# Patient Record
Sex: Female | Born: 1980
Health system: Southern US, Community
[De-identification: ages and names within clinical notes are randomized; demographics above are authoritative.]

## PROBLEM LIST (undated history)

## (undated) ENCOUNTER — Inpatient Hospital Stay (HOSPITAL_COMMUNITY): Payer: Self-pay

## (undated) DIAGNOSIS — R112 Nausea with vomiting, unspecified: Secondary | ICD-10-CM

## (undated) DIAGNOSIS — R51 Headache: Secondary | ICD-10-CM

## (undated) DIAGNOSIS — R011 Cardiac murmur, unspecified: Secondary | ICD-10-CM

## (undated) DIAGNOSIS — O139 Gestational [pregnancy-induced] hypertension without significant proteinuria, unspecified trimester: Secondary | ICD-10-CM

## (undated) DIAGNOSIS — R12 Heartburn: Secondary | ICD-10-CM

## (undated) DIAGNOSIS — R87629 Unspecified abnormal cytological findings in specimens from vagina: Secondary | ICD-10-CM

## (undated) DIAGNOSIS — Z9889 Other specified postprocedural states: Secondary | ICD-10-CM

## (undated) DIAGNOSIS — J069 Acute upper respiratory infection, unspecified: Secondary | ICD-10-CM

## (undated) DIAGNOSIS — O26899 Other specified pregnancy related conditions, unspecified trimester: Secondary | ICD-10-CM

## (undated) HISTORY — PX: TONSILLECTOMY: SUR1361

## (undated) HISTORY — PX: BREAST SURGERY: SHX581

## (undated) HISTORY — PX: AUGMENTATION MAMMAPLASTY: SUR837

## (undated) HISTORY — PX: WISDOM TOOTH EXTRACTION: SHX21

## (undated) HISTORY — DX: Acute upper respiratory infection, unspecified: J06.9

---

## 2000-04-29 ENCOUNTER — Ambulatory Visit (HOSPITAL_BASED_OUTPATIENT_CLINIC_OR_DEPARTMENT_OTHER): Admission: RE | Admit: 2000-04-29 | Discharge: 2000-04-30 | Payer: Self-pay | Admitting: Otolaryngology

## 2000-04-29 ENCOUNTER — Encounter (INDEPENDENT_AMBULATORY_CARE_PROVIDER_SITE_OTHER): Payer: Self-pay | Admitting: *Deleted

## 2002-05-07 ENCOUNTER — Emergency Department (HOSPITAL_COMMUNITY): Admission: EM | Admit: 2002-05-07 | Discharge: 2002-05-07 | Payer: Self-pay | Admitting: Emergency Medicine

## 2004-06-09 ENCOUNTER — Other Ambulatory Visit: Admission: RE | Admit: 2004-06-09 | Discharge: 2004-06-09 | Payer: Self-pay | Admitting: Obstetrics and Gynecology

## 2004-06-11 ENCOUNTER — Encounter: Admission: RE | Admit: 2004-06-11 | Discharge: 2004-06-11 | Payer: Self-pay | Admitting: Obstetrics and Gynecology

## 2005-06-25 ENCOUNTER — Other Ambulatory Visit: Admission: RE | Admit: 2005-06-25 | Discharge: 2005-06-25 | Payer: Self-pay | Admitting: Obstetrics and Gynecology

## 2007-01-03 ENCOUNTER — Ambulatory Visit (HOSPITAL_BASED_OUTPATIENT_CLINIC_OR_DEPARTMENT_OTHER): Admission: RE | Admit: 2007-01-03 | Discharge: 2007-01-03 | Payer: Self-pay | Admitting: Family Medicine

## 2007-01-09 ENCOUNTER — Ambulatory Visit: Payer: Self-pay | Admitting: Internal Medicine

## 2008-05-19 ENCOUNTER — Inpatient Hospital Stay (HOSPITAL_COMMUNITY): Admission: AD | Admit: 2008-05-19 | Discharge: 2008-05-20 | Payer: Self-pay | Admitting: Obstetrics and Gynecology

## 2008-05-27 ENCOUNTER — Inpatient Hospital Stay (HOSPITAL_COMMUNITY): Admission: AD | Admit: 2008-05-27 | Discharge: 2008-05-27 | Payer: Self-pay | Admitting: Obstetrics and Gynecology

## 2008-06-04 ENCOUNTER — Inpatient Hospital Stay (HOSPITAL_COMMUNITY): Admission: RE | Admit: 2008-06-04 | Discharge: 2008-06-07 | Payer: Self-pay | Admitting: Obstetrics & Gynecology

## 2008-06-08 ENCOUNTER — Encounter: Admission: RE | Admit: 2008-06-08 | Discharge: 2008-07-08 | Payer: Self-pay | Admitting: Obstetrics and Gynecology

## 2008-07-09 ENCOUNTER — Encounter: Admission: RE | Admit: 2008-07-09 | Discharge: 2008-08-07 | Payer: Self-pay | Admitting: Obstetrics and Gynecology

## 2008-08-08 ENCOUNTER — Encounter: Admission: RE | Admit: 2008-08-08 | Discharge: 2008-09-07 | Payer: Self-pay | Admitting: Obstetrics and Gynecology

## 2008-09-08 ENCOUNTER — Encounter: Admission: RE | Admit: 2008-09-08 | Discharge: 2008-10-07 | Payer: Self-pay | Admitting: Obstetrics and Gynecology

## 2008-10-08 ENCOUNTER — Encounter: Admission: RE | Admit: 2008-10-08 | Discharge: 2008-11-07 | Payer: Self-pay | Admitting: Obstetrics and Gynecology

## 2008-11-08 ENCOUNTER — Encounter: Admission: RE | Admit: 2008-11-08 | Discharge: 2008-12-08 | Payer: Self-pay | Admitting: Obstetrics and Gynecology

## 2008-11-20 ENCOUNTER — Ambulatory Visit (HOSPITAL_COMMUNITY): Admission: RE | Admit: 2008-11-20 | Discharge: 2008-11-20 | Payer: Self-pay | Admitting: Family Medicine

## 2008-12-09 ENCOUNTER — Encounter: Admission: RE | Admit: 2008-12-09 | Discharge: 2009-01-05 | Payer: Self-pay | Admitting: Obstetrics and Gynecology

## 2009-01-06 ENCOUNTER — Encounter: Admission: RE | Admit: 2009-01-06 | Discharge: 2009-01-25 | Payer: Self-pay | Admitting: Obstetrics and Gynecology

## 2010-11-09 ENCOUNTER — Encounter: Payer: Self-pay | Admitting: Family Medicine

## 2011-03-03 ENCOUNTER — Other Ambulatory Visit: Payer: Self-pay | Admitting: Obstetrics and Gynecology

## 2011-03-03 NOTE — Discharge Summary (Signed)
NAMEIDANIA, Brandi Webster               ACCOUNT NO.:  [id number]   MEDICAL RECORD NO.:  [id number]          PATIENT TYPE:  INP   LOCATION:  9105                          FACILITY:  [facility]   PHYSICIAN:  Randye Lobo, M.D.   DATE OF BIRTH:  [birth year]   DATE OF ADMISSION:  06/04/2008  DATE OF DISCHARGE:  06/07/2008                               DISCHARGE SUMMARY   FINAL DIAGNOSIS:  Intrauterine pregnancy at 33 weeks' gestation,  pregnancy-induced hypertension, failed induction of labor, rest of the  second stage of labor, maternal fever, and occiput posterior  presentation.   PROCEDURE:  Primary low transverse cesarean section.   SURGEON:  Gerrit Friends. Aldona Bar, M.D.   COMPLICATIONS:  None.   This 30 year old G1 P0 presents at 73 weeks' gestation for an induction  secondary to pregnancy-induced high blood pressure.  The patient's  antepartum course otherwise up to this point had been uncomplicated.  The patient had been having antepartum nonstress test performed which  were reactive prior to her induction.  The patient was admitted on  June 04, 2008, for induction of labor.  She progressed nicely, but she  began to push for about 2 hours with vertex still at zero station.  She  also started developing maternal temperature and at this point, decision  was to proceed with a cesarean section secondary to rest disorder of  descent.  The patient was taken to the operating room on June 04, 2008, by Dr. Annamaria Helling where a primary low transverse cesarean section  was performed with the delivery of a 7 pounds 14 ounces female infant with  Apgars of 9 and 9.  Delivery went without complications.  The patient's  postoperative course was benign without any significant fevers.  The  patient was afebrile.  The patient was kept in the hospital to  postoperative day #3.  She wanted her little boy circumcised prior to  discharge which was performed.  She was sent home on a regular diet,  told to decrease  activities, told to continue her vitamins and her iron  supplement daily, was given Vicodin 1-2 every 4-6 hours as needed for  pain, told she could use over-the-counter ibuprofen up to 600 mg every 6  hours as needed for pain, was to follow up in our office in 4 weeks.  Instructions and precautions were reviewed with the patient.  Labs on  discharge, the patient had a hemoglobin of 9.9, white blood cell count  of 13.1, and platelets of 149,000.      Leilani Able, P.A.-C.      Randye Lobo, M.D.  Electronically Signed    MB/MEDQ  D:  06/26/2008  T:  06/27/2008  Job:  [id number]

## 2011-03-03 NOTE — Op Note (Signed)
Brandi Webster, Brandi Webster               ACCOUNT NO.:  [id number]   MEDICAL RECORD NO.:  [id number]          PATIENT TYPE:  INP   LOCATION:  9105                          FACILITY:  [facility]   PHYSICIAN:  Gerrit Friends. Aldona Bar, M.D.   DATE OF BIRTH:  [birth year]   DATE OF PROCEDURE:  06/04/2008  DATE OF DISCHARGE:                               OPERATIVE REPORT   PREOPERATIVE DIAGNOSES:  1. A 38-week intrauterine pregnancy.  2. Failed induction.  3. Arrest of second stage of labor.  4. Maternal fever.   POSTOPERATIVE DIAGNOSES:  1. A 38-week intrauterine pregnancy.  2. Failed induction.  3. Arrest of second stage of labor.  4. Maternal fever.  5. Occiput posterior presentation.  6. Delivery of 7 pounds 14 ounces female infant with Apgars of 9 and 9.   PROCEDURE:  Primary low transverse cesarean section.   SURGEON:  Gerrit Friends. Aldona Bar, MD   ANESTHESIA:  Epidural.   HISTORY:  This 30 year old primigravida was admitted at 38 weeks for  induction because of hypertension.  She progressed very nicely in labor  but after pushing for approximately 2 hours, the vertex was still at 0  station at best and an arrest disorder of descent was diagnosis.  In  addition, a maternal fever was detected 200.9 degrees.  The patient was  scheduled for emergency cesarean section because of the failure to  progress.   The patient was taken to the operating room and prior to her arrival in  the operating room, her epidural was augmented and a dose of Ancef, 1 g  was given IV.  She had a Foley catheter placed earlier in the day.   Once in the operating room, she was prepped and draped in the usual  fashion.  After being placed in the supine position slightly tilted to  left and after she was prepped and draped and good anesthetic levels  were documented procedure was begun.   A Pfannenstiel incision was made and with minimal difficulty dissected  down sharply to and through the fascia in low transverse fashion with  hemostasis created at each layer.  Subfascial space was created  inferiorly and superiorly, muscles were separated in the midline.  I  have identified them appropriately with care taken to avoid the bowel  superiorly and the bladder inferiorly.  At this time, the bladder blade  was placed and the vesicouterine peritoneum was incised in low  transverse fashion and pushed off the lower segment with ease.  A sharp  incision into the lower segment and a low transverse fashion was carried  out with Metzenbaum scissors and extended laterally.  Thereafter, with  minimal difficulty, a viable female infant, which cried spontaneously at  once, was delivered from an OP presentation, vertex.  Infant cried  spontaneously at once and after the cord was clamped and cut, the infant  was passed off the awaiting team.  Infant weighed 7 pounds 14 ounces at  Apgars of 9 and 9 with subsequently taken to the nursery in good  condition.   Placenta was delivered intact and passed off the  cord blood donation  team.  The patient is a cord blood donor.   At this time, the uterus was rendered free of any remaining products of  conception and was exteriorized.  Good uterine contractility was  afforded with slowly given intravenous Pitocin and manual stimulation.  Closure of the uterine incision was then carried out using a single  layer of #1 Vicryl in a running locking fashion and several figure-of-  eight #1 Vicryls were placed thereafter for additional hemostasis.  Tubes and ovaries were inspected and noted be normal.  Uterine incision  was dry.  At this time, the abdomen lavaged of all free blood and clot  and the uterus was replaced into the abdominal cavity.  All counts were  noted to be correct and no foreign bodies were noted to the remaining of  the abdominal cavity.  At this time closure of the abdomen was begun in  layers.  The abdominal peritoneum was closed with 0 Vicryl in a running  fashion and muscle  secured with the same.  Assured of good fascial  hemostasis.  The fascia was then reapproximated with 0 Vicryl from angle  to midline bilaterally.  Subcutaneous tissue was running hemostatic and  staples were used to close the skin.  Sterile pressure dressing was  applied.  At this time, the patient was transport to recovery in  satisfactory condition having tolerated the procedure well.  Estimated  blood loss 500 mL.  All counts correct x2.   In summary, this patient had an arrest in the second stage of labor  after a failed induction attempt for hypertension at 38 weeks'  gestation.  In addition, there was a maternal fever noted.  In the  operating room, an occiput posterior presentation was discovered.   At the conclusion of procedure both mother and baby were doing well in  the respective recovery areas.      Gerrit Friends. Aldona Bar, M.D.  Electronically Signed     RMW/MEDQ  D:  06/04/2008  T:  06/05/2008  Job:  [id number]

## 2011-03-06 NOTE — Procedures (Signed)
NAME:  Brandi Webster, Brandi Webster               ACCOUNT NO.:  [id number]   MEDICAL RECORD NO.:  [id number]          PATIENT TYPE:  OUT   LOCATION:  SLEEP CENTER                 FACILITY:  [facility]   PHYSICIAN:  Clinton D. Maple Hudson, MD, FCCP, FACPDATE OF BIRTH:  07-17-[birth year]   DATE OF STUDY:                            NOCTURNAL POLYSOMNOGRAM   INDICATIONS FOR PROCEDURE:  Hypersomnia with sleep apnea. Sleep walking,  sleep talking.   HOME MEDICATIONS:  Limited to multivitamin, fish oil, and Omega III.   RESULTS:  Epward sleepiness score 12/24, BMI 20.5, weight 116 pounds.   SLEEP ARCHITECTURE:  Total sleep time 348 minutes with sleep efficiency  985. Stage 1 was 1%; Stage 2, 58%; Stages 3 and 4, 26%. REM 16% of total  sleep time. Sleep latency 7 minutes, REM latency 65 minutes, awake after  sleep onset 3 minutes. Arousal index 9.7. No bedtime medication was  taken.   RESPIRATORY DATA:  Apnea hypopnea index (AHI, RDI) zero obstructive  events per hour. She slept supine and on right side.   OXYGEN DATA:  Mild snoring with oxygen desaturation to a nadir of 95%.  Mean oxygen saturation through the study was 98% on room air.   CARDIAC DATA:  Normal sinus rhythm.   MOVEMENT/PARASOMNIA:  A total of 28 limb jerks were recorded, of which 9  were associated with arousal or awakening for periodic limb movement  with arousal index of 1.6 per hour, which is insignificant. No bathroom  trips. No unusual movement or behavior was recorded.   IMPRESSION/RECOMMENDATIONS:  1. Unremarkable sleep architecture with no unusual movement behavior      or sleep associated activity.  2. No sleep disordered breathing events were recorded.      Clinton D. Maple Hudson, MD, [facility], FACP  Diplomate, Biomedical engineer of Sleep Medicine  Electronically Signed     CDY/MEDQ  D:  01/09/2007 15:41:36  T:  01/09/2007 19:07:53  Job:  [id number]

## 2011-07-17 LAB — URINALYSIS, ROUTINE W REFLEX MICROSCOPIC
Bilirubin Urine: NEGATIVE
Hgb urine dipstick: NEGATIVE
Nitrite: NEGATIVE
Specific Gravity, Urine: 1.015
Urobilinogen, UA: 0.2
pH: 6

## 2011-07-17 LAB — URINE MICROSCOPIC-ADD ON: RBC / HPF: NONE SEEN

## 2013-05-02 LAB — OB RESULTS CONSOLE GC/CHLAMYDIA
CHLAMYDIA, DNA PROBE: NEGATIVE
GC PROBE AMP, GENITAL: NEGATIVE

## 2013-06-13 LAB — OB RESULTS CONSOLE HGB/HCT, BLOOD
HCT: 35 %
Hemoglobin: 11.4 g/dL

## 2013-06-13 LAB — OB RESULTS CONSOLE HIV ANTIBODY (ROUTINE TESTING): HIV: NONREACTIVE

## 2013-06-13 LAB — OB RESULTS CONSOLE RUBELLA ANTIBODY, IGM: Rubella: IMMUNE

## 2013-07-11 LAB — OB RESULTS CONSOLE HEPATITIS B SURFACE ANTIGEN: HEP B S AG: NEGATIVE

## 2013-07-11 LAB — OB RESULTS CONSOLE RPR: RPR: NONREACTIVE

## 2013-07-11 LAB — OB RESULTS CONSOLE ABO/RH: RH Type: POSITIVE

## 2013-10-19 NOTE — L&D Delivery Note (Addendum)
TOLAC patient was C/C/+2 and pushed for approx 20 minutes with epidural.   NSVD female infant, ROA, Apgars 9/9, weight pending.   The patient had a second degree perineal lac which extended superficially to the anus, but not inside, sphincter was visible and completely intact. She also had extensive bilateral labial tears repaired with 3-0vicryl and a small periurethral tear repaired with 3-0 vicryl. Fundus was firm. EBL was expected. Placenta was delivered intact. Vagina was clear.  Baby was vigorous and doing skin to skin with mother.  Circ desired.  Philip AspenALLAHAN, Wilhelmenia Addis

## 2013-10-22 ENCOUNTER — Encounter (HOSPITAL_COMMUNITY): Payer: Self-pay

## 2013-10-22 ENCOUNTER — Inpatient Hospital Stay (HOSPITAL_COMMUNITY)
Admission: AD | Admit: 2013-10-22 | Discharge: 2013-10-24 | DRG: 778 | Disposition: A | Discharge status: Home / Self Care | Payer: BC Managed Care – PPO | Source: Ambulatory Visit | Attending: Obstetrics and Gynecology | Admitting: Obstetrics and Gynecology

## 2013-10-22 ENCOUNTER — Inpatient Hospital Stay (HOSPITAL_COMMUNITY): Payer: BC Managed Care – PPO

## 2013-10-22 DIAGNOSIS — O479 False labor, unspecified: Secondary | ICD-10-CM

## 2013-10-22 DIAGNOSIS — O34219 Maternal care for unspecified type scar from previous cesarean delivery: Secondary | ICD-10-CM

## 2013-10-22 DIAGNOSIS — O4703 False labor before 37 completed weeks of gestation, third trimester: Secondary | ICD-10-CM

## 2013-10-22 DIAGNOSIS — O4700 False labor before 37 completed weeks of gestation, unspecified trimester: Principal | ICD-10-CM | POA: Diagnosis present

## 2013-10-22 LAB — CBC
HEMATOCRIT: 33.9 % — AB (ref 36.0–46.0)
HEMOGLOBIN: 11.6 g/dL — AB (ref 12.0–15.0)
MCH: 30.9 pg (ref 26.0–34.0)
MCHC: 34.2 g/dL (ref 30.0–36.0)
MCV: 90.2 fL (ref 78.0–100.0)
Platelets: 234 10*3/uL (ref 150–400)
RBC: 3.76 MIL/uL — AB (ref 3.87–5.11)
RDW: 12.9 % (ref 11.5–15.5)
WBC: 13.7 10*3/uL — AB (ref 4.0–10.5)

## 2013-10-22 LAB — URINALYSIS, ROUTINE W REFLEX MICROSCOPIC
Bilirubin Urine: NEGATIVE
GLUCOSE, UA: NEGATIVE mg/dL
Hgb urine dipstick: NEGATIVE
KETONES UR: NEGATIVE mg/dL
LEUKOCYTES UA: NEGATIVE
Nitrite: NEGATIVE
PH: 6.5 (ref 5.0–8.0)
Protein, ur: NEGATIVE mg/dL
Urobilinogen, UA: 0.2 mg/dL (ref 0.0–1.0)

## 2013-10-22 LAB — TYPE AND SCREEN
ABO/RH(D): O POS
ANTIBODY SCREEN: NEGATIVE

## 2013-10-22 LAB — OB RESULTS CONSOLE GBS: STREP GROUP B AG: NEGATIVE

## 2013-10-22 MED ORDER — PRENATAL MULTIVITAMIN CH
1.0000 | ORAL_TABLET | Freq: Every day | ORAL | Status: DC
  Administered 2013-10-23: 1 via ORAL
  Filled 2013-10-22: qty 1

## 2013-10-22 MED ORDER — MAGNESIUM SULFATE 40 G IN LACTATED RINGERS - SIMPLE
2.0000 g/h | INTRAVENOUS | Status: AC
  Administered 2013-10-23: 2 g/h via INTRAVENOUS
  Filled 2013-10-22 (×2): qty 500

## 2013-10-22 MED ORDER — ZOLPIDEM TARTRATE 5 MG PO TABS: 5.0000 mg | ORAL_TABLET | Freq: Every evening | ORAL | Status: DC | PRN

## 2013-10-22 MED ORDER — NIFEDIPINE 10 MG PO CAPS
10.0000 mg | ORAL_CAPSULE | Freq: Once | ORAL | Status: AC
  Administered 2013-10-22: 10 mg via ORAL
  Filled 2013-10-22: qty 1

## 2013-10-22 MED ORDER — IBUPROFEN 600 MG PO TABS
600.0000 mg | ORAL_TABLET | Freq: Once | ORAL | Status: AC
  Administered 2013-10-22: 600 mg via ORAL
  Filled 2013-10-22: qty 1

## 2013-10-22 MED ORDER — NIFEDIPINE 10 MG PO CAPS
10.0000 mg | ORAL_CAPSULE | ORAL | Status: AC | PRN
  Administered 2013-10-22 (×4): 10 mg via ORAL
  Filled 2013-10-22 (×4): qty 1

## 2013-10-22 MED ORDER — DOCUSATE SODIUM 100 MG PO CAPS
100.0000 mg | ORAL_CAPSULE | Freq: Every day | ORAL | Status: DC
  Administered 2013-10-23: 100 mg via ORAL
  Filled 2013-10-22 (×3): qty 1

## 2013-10-22 MED ORDER — BETAMETHASONE SOD PHOS & ACET 6 (3-3) MG/ML IJ SUSP
12.0000 mg | Freq: Once | INTRAMUSCULAR | Status: AC
  Administered 2013-10-22: 12 mg via INTRAMUSCULAR
  Filled 2013-10-22: qty 2

## 2013-10-22 MED ORDER — CALCIUM CARBONATE ANTACID 500 MG PO CHEW
2.0000 | CHEWABLE_TABLET | ORAL | Status: DC | PRN
  Filled 2013-10-22: qty 2

## 2013-10-22 MED ORDER — LACTATED RINGERS IV BOLUS (SEPSIS)
1000.0000 mL | INTRAVENOUS | Status: DC
  Administered 2013-10-22: 1000 mL via INTRAVENOUS

## 2013-10-22 MED ORDER — ACETAMINOPHEN 325 MG PO TABS: 650.0000 mg | ORAL_TABLET | ORAL | Status: DC | PRN

## 2013-10-22 MED ORDER — BETAMETHASONE SOD PHOS & ACET 6 (3-3) MG/ML IJ SUSP
12.0000 mg | Freq: Once | INTRAMUSCULAR | Status: DC
  Filled 2013-10-22: qty 2

## 2013-10-22 MED ORDER — BETAMETHASONE SOD PHOS & ACET 6 (3-3) MG/ML IJ SUSP
12.0000 mg | Freq: Once | INTRAMUSCULAR | Status: AC
  Administered 2013-10-23: 12 mg via INTRAMUSCULAR
  Filled 2013-10-22: qty 2

## 2013-10-22 MED ORDER — LACTATED RINGERS IV SOLN
INTRAVENOUS | Status: DC
  Administered 2013-10-22 – 2013-10-24 (×4): via INTRAVENOUS

## 2013-10-22 MED ORDER — MAGNESIUM SULFATE BOLUS VIA INFUSION
4.0000 g | Freq: Once | INTRAVENOUS | Status: AC
  Administered 2013-10-22: 4 g via INTRAVENOUS
  Filled 2013-10-22: qty 500

## 2013-10-22 NOTE — H&P (Signed)
Pt is a 33 y/o white female G2P1001 who presented to the ER c/o persistent ctxs which began several hours after she had sex. In the ER the pt was having ctxs q 3-608mins. She was given 4 doses of Procardia and IVFs. The contractions persisted. She can not tolerate terb. She could not have a FFN. She is admitted for Mag. She has a h.o.a C/S and plans to have a TOLAC, Please see NP H and P for PE/PMHX.  Plan/ Admit with Mag. Will give Betamethasone. If contractions do not stop will start antibiotics. Check GBS.

## 2013-10-22 NOTE — MAU Note (Signed)
Pt presents complaining of contractions every 2 minutes since 9am. States she tried drinking water and resting. Denies vaginal bleeding and loss of fluid.

## 2013-10-22 NOTE — MAU Provider Note (Signed)
Chief Complaint:  Contractions   First Provider Initiated Contact with Patient 10/22/13 1620      HPI: Brandi Webster is a 33 y.o. G2P1001 at 4878w6d who presents to maternity admissions reporting mild contractions every 2 minutes since 9 AM. No improvement with hydration. No history of preterm labor or short cervix this pregnancy. Received terbutaline once at 35 weeks with previous pregnancy and felt extremely anxious and tachycardic. Denies leakage of fluid or vaginal bleeding. Good fetal movement. Last IC last night.   Pregnancy Course: Uncomplicated. Prior C/S 5 years ago for FTP and maternal fever at 10 cm.   Past Medical History: History reviewed. No pertinent past medical history.  Past obstetric history: OB History  Gravida Para Term Preterm AB SAB TAB Ectopic Multiple Living  2 1 1       1     # Outcome Date GA Lbr Len/2nd Weight Sex Delivery Anes PTL Lv  2 CUR           1 TRM      LTCS   Y      Past Surgical History: Past Surgical History  Procedure Laterality Date  . Cesarean section       Family History: History reviewed. No pertinent family history.  Social History: History  Substance Use Topics  . Smoking status: Never Smoker   . Smokeless tobacco: Not on file  . Alcohol Use: No    Allergies: No Known Allergies  Meds:  Prescriptions prior to admission  Medication Sig Dispense Refill  . Docosahexaenoic Acid (DHA PO) Take 1 tablet by mouth daily.      . Prenatal Vit-Fe Fumarate-FA (PRENATAL MULTIVITAMIN) TABS tablet Take 1 tablet by mouth daily at 12 noon.        ROS: Pertinent findings in history of present illness.  Physical Exam  Blood pressure 131/71, pulse 98, temperature 97.4 F (36.3 C), temperature source Oral, resp. rate 18, last menstrual period 03/20/2013. GENERAL: Well-developed, well-nourished female in mild distress.  HEENT: normocephalic HEART: normal rate RESP: normal effort ABDOMEN: Soft, non-tender, gravid appropriate for gestational  age EXTREMITIES: Nontender, no edema NEURO: alert and oriented SPECULUM EXAM: NEFG, physiologic discharge and small amount of clear mucus, no blood, cervix clean. Unable to collect fFN due to IC < 24 hours ago. Dilation: Closed Effacement (%): 50 Cervical Position: Posterior, soft Station: Ballotable Exam by:: Ivonne AndrewV Jaxsyn Catalfamo CNM Vertex/slightly right oblique per leopold's  FHT:  Baseline 140 , moderate variability, accelerations present, no decelerations Contractions: q 2-3 mins, mild   Labs: Results for orders placed during the hospital encounter of 10/22/13 (from the past 24 hour(s))  URINALYSIS, ROUTINE W REFLEX MICROSCOPIC     Status: Abnormal   Collection Time    10/22/13  4:00 PM      Result Value Range   Color, Urine YELLOW  YELLOW   APPearance CLEAR  CLEAR   Specific Gravity, Urine <1.005 (*) 1.005 - 1.030   pH 6.5  5.0 - 8.0   Glucose, UA NEGATIVE  NEGATIVE mg/dL   Hgb urine dipstick NEGATIVE  NEGATIVE   Bilirubin Urine NEGATIVE  NEGATIVE   Ketones, ur NEGATIVE  NEGATIVE mg/dL   Protein, ur NEGATIVE  NEGATIVE mg/dL   Urobilinogen, UA 0.2  0.0 - 1.0 mg/dL   Nitrite NEGATIVE  NEGATIVE   Leukocytes, UA NEGATIVE  NEGATIVE   Imaging:  No results found.  MAU Course: IV bolus, Procardia.   Contractions spaced out to Q3-7 minutes. No cervical change. Notified  Dr. Dareen Piano of cervical exam, contractions, prior C-section, interventions up to this point. Recommends terbutaline and betamethasone. Informed him of patient's adverse reaction to terbutaline and refusal when offered by CNM. Recommended 30 more minutes of IV fluids. May have to observe patient overnight and give mag sulfate if contractions continue.   CNM recommended terbutaline to patient. She became tearful and stated she was very afraid to have it again due to prior adverse reaction. Informed her that if contractions continue this frequently Dr. Dareen Piano would recommend observation and magnesium sulfate. Patient is  agreeable. States she wants to do whatever is best for her baby.  Assessment: 1. Preterm contractions, third trimester   2. History of cesarean delivery, currently pregnant    Plan: Observe on antenatal per consult with Dr. Dareen Piano. Magnesium sulfate 4 g load, then 2 g per hour. Dr. Dareen Piano assuming care of patient. GBS  [name], [place] 10/22/2013 7:46 PM

## 2013-10-23 LAB — ABO/RH: ABO/RH(D): O POS

## 2013-10-23 NOTE — Progress Notes (Signed)
No complaints, still feeling occasional ctx, but much more mild and less frequent.  No LOF/VB.  Good FM.  Filed Vitals:   10/23/13 0830 10/23/13 0930 10/23/13 1030 10/23/13 1212  BP:    115/59  Pulse:    97  Temp:    97.7 F (36.5 C)  TempSrc:    Oral  Resp: 18 20 18 18   Height:      Weight:      SpO2:         Lab Results  Component Value Date   WBC 13.7* 10/22/2013   HGB 11.6* 10/22/2013   HCT 33.9* 10/22/2013   MCV 90.2 10/22/2013   PLT 234 10/22/2013    --/--/O POS, O POS (01/04 2000)  A/P PT ctx.. Will continue MgSO4 until 24hrs, 2nd dose betamethasone tonight.  Will monitor overnight and if stable plan to d/c in am.   Philip AspenALLAHAN, Adea Geisel

## 2013-10-23 NOTE — Progress Notes (Signed)
Ur chart review completed.  

## 2013-10-24 NOTE — Progress Notes (Signed)
Pt. Is stable and ready to be discharged home. All discharge instructions given and all questions answered. Pt. To follow up with MD in one week. Pt. Left with husband.

## 2013-10-24 NOTE — Discharge Summary (Signed)
  Pt was admitted to tx PTL after efforts in the ER were unsuccessful. She was placed on MgSO4 and given Betamethasone. The contractions stopped. She will be on bedrest for 24 hours then increase activity. She will follow up in the office in one week.

## 2013-10-24 NOTE — Progress Notes (Signed)
Pt has had only a rare contraction in last 24 hours. Will stop Mg and discharge to home.

## 2013-10-26 LAB — CULTURE, BETA STREP (GROUP B ONLY)

## 2013-12-06 ENCOUNTER — Encounter (HOSPITAL_COMMUNITY): Payer: Self-pay | Admitting: Pharmacist

## 2013-12-18 ENCOUNTER — Encounter (HOSPITAL_COMMUNITY): Payer: Self-pay | Admitting: *Deleted

## 2013-12-18 ENCOUNTER — Encounter (HOSPITAL_COMMUNITY): Payer: BC Managed Care – PPO | Admitting: Anesthesiology

## 2013-12-18 ENCOUNTER — Inpatient Hospital Stay (HOSPITAL_COMMUNITY)
Admission: AD | Admit: 2013-12-18 | Discharge: 2013-12-20 | DRG: 774 | Disposition: A | Discharge status: Home / Self Care | Payer: BC Managed Care – PPO | Source: Ambulatory Visit | Attending: Obstetrics and Gynecology | Admitting: Obstetrics and Gynecology

## 2013-12-18 ENCOUNTER — Inpatient Hospital Stay (HOSPITAL_COMMUNITY): Payer: BC Managed Care – PPO | Admitting: Anesthesiology

## 2013-12-18 ENCOUNTER — Encounter (HOSPITAL_COMMUNITY): Payer: Self-pay

## 2013-12-18 ENCOUNTER — Encounter (HOSPITAL_COMMUNITY)
Admission: RE | Admit: 2013-12-18 | Discharge: 2013-12-18 | Disposition: A | Discharge status: Home / Self Care | Payer: BC Managed Care – PPO | Source: Ambulatory Visit | Attending: Obstetrics and Gynecology | Admitting: Obstetrics and Gynecology

## 2013-12-18 DIAGNOSIS — R011 Cardiac murmur, unspecified: Secondary | ICD-10-CM | POA: Diagnosis present

## 2013-12-18 DIAGNOSIS — O1002 Pre-existing essential hypertension complicating childbirth: Principal | ICD-10-CM | POA: Diagnosis present

## 2013-12-18 DIAGNOSIS — O34219 Maternal care for unspecified type scar from previous cesarean delivery: Secondary | ICD-10-CM | POA: Diagnosis present

## 2013-12-18 DIAGNOSIS — R12 Heartburn: Secondary | ICD-10-CM | POA: Diagnosis present

## 2013-12-18 HISTORY — DX: Other specified pregnancy related conditions, unspecified trimester: O26.899

## 2013-12-18 HISTORY — DX: Gestational (pregnancy-induced) hypertension without significant proteinuria, unspecified trimester: O13.9

## 2013-12-18 HISTORY — DX: Cardiac murmur, unspecified: R01.1

## 2013-12-18 HISTORY — DX: Other specified postprocedural states: Z98.890

## 2013-12-18 HISTORY — DX: Headache: R51

## 2013-12-18 HISTORY — DX: Other specified postprocedural states: R11.2

## 2013-12-18 HISTORY — DX: Heartburn: R12

## 2013-12-18 HISTORY — DX: Unspecified abnormal cytological findings in specimens from vagina: R87.629

## 2013-12-18 LAB — CBC
HCT: 33.9 % — ABNORMAL LOW (ref 36.0–46.0)
Hemoglobin: 11.2 g/dL — ABNORMAL LOW (ref 12.0–15.0)
MCH: 29.9 pg (ref 26.0–34.0)
MCHC: 33 g/dL (ref 30.0–36.0)
MCV: 90.6 fL (ref 78.0–100.0)
Platelets: 217 10*3/uL (ref 150–400)
RBC: 3.74 MIL/uL — ABNORMAL LOW (ref 3.87–5.11)
RDW: 14.1 % (ref 11.5–15.5)
WBC: 11.3 10*3/uL — AB (ref 4.0–10.5)

## 2013-12-18 LAB — TYPE AND SCREEN
ABO/RH(D): O POS
ANTIBODY SCREEN: NEGATIVE

## 2013-12-18 MED ORDER — OXYTOCIN BOLUS FROM INFUSION
500.0000 mL | INTRAVENOUS | Status: DC
  Administered 2013-12-18: 500 mL via INTRAVENOUS

## 2013-12-18 MED ORDER — WITCH HAZEL-GLYCERIN EX PADS: 1.0000 "application " | MEDICATED_PAD | CUTANEOUS | Status: DC | PRN

## 2013-12-18 MED ORDER — TETANUS-DIPHTH-ACELL PERTUSSIS 5-2.5-18.5 LF-MCG/0.5 IM SUSP: 0.5000 mL | Freq: Once | INTRAMUSCULAR | Status: DC

## 2013-12-18 MED ORDER — PHENYLEPHRINE 40 MCG/ML (10ML) SYRINGE FOR IV PUSH (FOR BLOOD PRESSURE SUPPORT)
80.0000 ug | PREFILLED_SYRINGE | INTRAVENOUS | Status: DC | PRN
  Filled 2013-12-18: qty 2
  Filled 2013-12-18: qty 10

## 2013-12-18 MED ORDER — DIBUCAINE 1 % RE OINT: 1.0000 "application " | TOPICAL_OINTMENT | RECTAL | Status: DC | PRN

## 2013-12-18 MED ORDER — OXYCODONE-ACETAMINOPHEN 5-325 MG PO TABS
1.0000 | ORAL_TABLET | ORAL | Status: DC | PRN
Start: 2013-12-18 — End: 2013-12-20

## 2013-12-18 MED ORDER — IBUPROFEN 600 MG PO TABS
600.0000 mg | ORAL_TABLET | Freq: Four times a day (QID) | ORAL | Status: DC
  Administered 2013-12-18 – 2013-12-20 (×6): 600 mg via ORAL
  Filled 2013-12-18 (×6): qty 1

## 2013-12-18 MED ORDER — ONDANSETRON HCL 4 MG/2ML IJ SOLN: 4.0000 mg | Freq: Four times a day (QID) | INTRAMUSCULAR | Status: DC | PRN

## 2013-12-18 MED ORDER — LACTATED RINGERS IV SOLN: INTRAVENOUS | Status: DC

## 2013-12-18 MED ORDER — DIPHENHYDRAMINE HCL 50 MG/ML IJ SOLN: 12.5000 mg | INTRAMUSCULAR | Status: DC | PRN

## 2013-12-18 MED ORDER — ONDANSETRON HCL 4 MG/2ML IJ SOLN
4.0000 mg | INTRAMUSCULAR | Status: DC | PRN
Start: 2013-12-18 — End: 2013-12-20

## 2013-12-18 MED ORDER — ONDANSETRON HCL 4 MG PO TABS: 4.0000 mg | ORAL_TABLET | ORAL | Status: DC | PRN

## 2013-12-18 MED ORDER — PHENYLEPHRINE 40 MCG/ML (10ML) SYRINGE FOR IV PUSH (FOR BLOOD PRESSURE SUPPORT)
80.0000 ug | PREFILLED_SYRINGE | INTRAVENOUS | Status: DC | PRN
  Filled 2013-12-18: qty 2

## 2013-12-18 MED ORDER — LACTATED RINGERS IV SOLN: 500.0000 mL | Freq: Once | INTRAVENOUS | Status: DC

## 2013-12-18 MED ORDER — FENTANYL 2.5 MCG/ML BUPIVACAINE 1/10 % EPIDURAL INFUSION (WH - ANES)
14.0000 mL/h | INTRAMUSCULAR | Status: DC | PRN
  Filled 2013-12-18: qty 125

## 2013-12-18 MED ORDER — SENNOSIDES-DOCUSATE SODIUM 8.6-50 MG PO TABS
2.0000 | ORAL_TABLET | ORAL | Status: DC
  Administered 2013-12-20: 2 via ORAL
  Filled 2013-12-18: qty 2

## 2013-12-18 MED ORDER — EPHEDRINE 5 MG/ML INJ
10.0000 mg | INTRAVENOUS | Status: DC | PRN
  Filled 2013-12-18: qty 4
  Filled 2013-12-18: qty 2

## 2013-12-18 MED ORDER — ZOLPIDEM TARTRATE 5 MG PO TABS: 5.0000 mg | ORAL_TABLET | Freq: Every evening | ORAL | Status: DC | PRN

## 2013-12-18 MED ORDER — FENTANYL 2.5 MCG/ML BUPIVACAINE 1/10 % EPIDURAL INFUSION (WH - ANES)
INTRAMUSCULAR | Status: DC | PRN
  Administered 2013-12-18: 14 mL/h via EPIDURAL

## 2013-12-18 MED ORDER — OXYTOCIN 40 UNITS IN LACTATED RINGERS INFUSION - SIMPLE MED
62.5000 mL/h | INTRAVENOUS | Status: DC
  Filled 2013-12-18: qty 1000

## 2013-12-18 MED ORDER — IBUPROFEN 600 MG PO TABS
600.0000 mg | ORAL_TABLET | Freq: Four times a day (QID) | ORAL | Status: DC | PRN
  Filled 2013-12-18: qty 1

## 2013-12-18 MED ORDER — TERBUTALINE SULFATE 1 MG/ML IJ SOLN
INTRAMUSCULAR | Status: AC
  Filled 2013-12-18: qty 1

## 2013-12-18 MED ORDER — LIDOCAINE HCL (PF) 1 % IJ SOLN
30.0000 mL | INTRAMUSCULAR | Status: DC | PRN
  Filled 2013-12-18: qty 30

## 2013-12-18 MED ORDER — PRENATAL MULTIVITAMIN CH
1.0000 | ORAL_TABLET | Freq: Every day | ORAL | Status: DC
  Administered 2013-12-19: 1 via ORAL
  Filled 2013-12-18: qty 1

## 2013-12-18 MED ORDER — CITRIC ACID-SODIUM CITRATE 334-500 MG/5ML PO SOLN: 30.0000 mL | ORAL | Status: DC | PRN

## 2013-12-18 MED ORDER — LANOLIN HYDROUS EX OINT: TOPICAL_OINTMENT | CUTANEOUS | Status: DC | PRN

## 2013-12-18 MED ORDER — BENZOCAINE-MENTHOL 20-0.5 % EX AERO
1.0000 "application " | INHALATION_SPRAY | CUTANEOUS | Status: DC | PRN
  Administered 2013-12-19: 1 via TOPICAL
  Filled 2013-12-18: qty 56

## 2013-12-18 MED ORDER — LACTATED RINGERS IV SOLN
500.0000 mL | INTRAVENOUS | Status: DC | PRN
Start: 2013-12-18 — End: 2013-12-18

## 2013-12-18 MED ORDER — ACETAMINOPHEN 325 MG PO TABS: 650.0000 mg | ORAL_TABLET | ORAL | Status: DC | PRN

## 2013-12-18 MED ORDER — LIDOCAINE HCL (PF) 1 % IJ SOLN
INTRAMUSCULAR | Status: DC | PRN
  Administered 2013-12-18: 7 mL
  Administered 2013-12-18: 9 mL

## 2013-12-18 MED ORDER — DIPHENHYDRAMINE HCL 25 MG PO CAPS: 25.0000 mg | ORAL_CAPSULE | Freq: Four times a day (QID) | ORAL | Status: DC | PRN

## 2013-12-18 MED ORDER — SIMETHICONE 80 MG PO CHEW
80.0000 mg | CHEWABLE_TABLET | ORAL | Status: DC | PRN
Start: 2013-12-18 — End: 2013-12-20

## 2013-12-18 MED ORDER — EPHEDRINE 5 MG/ML INJ
10.0000 mg | INTRAVENOUS | Status: DC | PRN
  Filled 2013-12-18: qty 2

## 2013-12-18 NOTE — Patient Instructions (Addendum)
   Your procedure is scheduled on: Tuesday, Mar 3  [facility]  1030 AM Pick up the phone at the desk and dial [phone] and inform us of your arrival.  Please call this number if you have any problems the morning of surgery: [phone]  Remember: Do not eat food after midnight: Monday Do not drink clear liquids after: 8 AM Tuesday, day of surgery Take these medicines the morning of surgery with a SIP OF WATER:  None  Do not wear jewelry, make-up, or FINGER nail polish No metal in your hair or on your body. Do not wear lotions, powders, perfumes.  You may wear deodorant.  Do not bring valuables to the hospital. Contacts, dentures or bridgework may not be worn into surgery.  Leave suitcase in the car. After Surgery it may be brought to your room. For patients being admitted to the hospital, checkout time is 11:00am the day of discharge.  Home with husband B.J. cell [phone]

## 2013-12-18 NOTE — MAU Note (Signed)
Scheduled for a c/s,as they would not let her go past 40wks.  Was induced for PIH with first- no BP problems with present.  Would prefer VBAC

## 2013-12-18 NOTE — H&P (Signed)
33 y.o. 1732w0d  G2P1001 comes in c/o ctx.  Otherwise has good fetal movement and no bleeding.  Past Medical History  Diagnosis Date  . PONV (postoperative nausea and vomiting)   . Heartburn in pregnancy   . Headache(784.0)     otc med prn  . Heart murmur   . Pregnancy induced hypertension     first preg  . Preterm labor     2nd preg  . Vaginal Pap smear, abnormal     colpo, ok since    Past Surgical History  Procedure Laterality Date  . Cesarean section  2009  . Tonsillectomy    . Wisdom tooth extraction    . Breast surgery      augmentation    OB History  Gravida Para Term Preterm AB SAB TAB Ectopic Multiple Living  2 1 1       1     # Outcome Date GA Lbr Len/2nd Weight Sex Delivery Anes PTL Lv  2 CUR           1 TRM 06/04/08 3281w0d  3.572 kg (7 lb 14 oz) M LTCS EPI  Y     Comments: PIH, arrest of descent      History   Social History  . Marital Status: Married    Spouse Name: N/A    Number of Children: N/A  . Years of Education: N/A   Occupational History  . Not on file.   Social History Main Topics  . Smoking status: Never Smoker   . Smokeless tobacco: Never Used  . Alcohol Use: No  . Drug Use: No  . Sexual Activity: Yes    Birth Control/ Protection: None     Comment: pregnant   Other Topics Concern  . Not on file   Social History Narrative  . No narrative on file   Codeine    Prenatal Transfer Tool  Maternal Diabetes: No Genetic Screening: Normal Maternal Ultrasounds/Referrals: Normal Fetal Ultrasounds or other Referrals:  None Maternal Substance Abuse:  No Significant Maternal Medications:  None Significant Maternal Lab Results: Lab values include: Group B Strep negative  Other PNC: uncomplicated. S>D at 37 wk visit.  US: 85% 7#6, AFI 19    Filed Vitals:   12/18/13 1958  BP: 145/91  Pulse: 69  Temp:   Resp:      Lungs/Cor:  NAD Abdomen:  soft, gravid Ex:  no cords, erythema SVE:  5/100/-2 FHTs:  135, good STV, NST R Toco:   q2-3   A/P   [facility]  [facility] consent obtained  Epidural on request  GBS neg  Brandi Webster

## 2013-12-18 NOTE — MAU Note (Addendum)
Pt was here to register for c/s- is contracting

## 2013-12-18 NOTE — Anesthesia Procedure Notes (Signed)
Epidural Patient location during procedure: OB Start time: 12/18/2013 7:18 PM End time: 12/18/2013 7:22 PM  Staffing Anesthesiologist: Leilani AbleHATCHETT, Braylen Denunzio Performed by: anesthesiologist   Preanesthetic Checklist Completed: patient identified, surgical consent, pre-op evaluation, timeout performed, IV checked, risks and benefits discussed and monitors and equipment checked  Epidural Patient position: sitting Prep: site prepped and draped and DuraPrep Patient monitoring: continuous pulse ox and blood pressure Approach: midline Injection technique: LOR air  Needle:  Needle type: Tuohy  Needle gauge: 17 G Needle length: 9 cm and 9 Needle insertion depth: 6 cm Catheter type: closed end flexible Catheter size: 19 Gauge Catheter at skin depth: 11 cm Test dose: negative and Other  Assessment Sensory level: T9 Events: blood not aspirated, injection not painful, no injection resistance, negative IV test and no paresthesia  Additional Notes Reason for block:procedure for pain

## 2013-12-18 NOTE — Anesthesia Preprocedure Evaluation (Signed)
Anesthesia Evaluation  Patient identified by MRN, date of birth, ID band Patient awake    Reviewed: Allergy & Precautions, H&P , NPO status , Patient's Chart, lab work & pertinent test results  Airway Mallampati: II TM Distance: >3 FB Neck ROM: full    Dental no notable dental hx.    Pulmonary neg pulmonary ROS,    Pulmonary exam normal       Cardiovascular hypertension, negative cardio ROS      Neuro/Psych negative psych ROS   GI/Hepatic negative GI ROS, Neg liver ROS,   Endo/Other  negative endocrine ROS  Renal/GU negative Renal ROS     Musculoskeletal   Abdominal Normal abdominal exam  (+)   Peds  Hematology negative hematology ROS (+)   Anesthesia Other Findings   Reproductive/Obstetrics (+) Pregnancy                           Anesthesia Physical Anesthesia Plan  ASA: II  Anesthesia Plan: Epidural   Post-op Pain Management:    Induction:   Airway Management Planned:   Additional Equipment:   Intra-op Plan:   Post-operative Plan:   Informed Consent: I have reviewed the patients History and Physical, chart, labs and discussed the procedure including the risks, benefits and alternatives for the proposed anesthesia with the patient or authorized representative who has indicated his/her understanding and acceptance.     Plan Discussed with:   Anesthesia Plan Comments:         Anesthesia Quick Evaluation

## 2013-12-19 ENCOUNTER — Encounter (HOSPITAL_COMMUNITY)
Admission: AD | Disposition: A | Discharge status: Home / Self Care | Payer: Self-pay | Source: Ambulatory Visit | Attending: Obstetrics and Gynecology

## 2013-12-19 ENCOUNTER — Encounter (HOSPITAL_COMMUNITY): Payer: Self-pay | Admitting: *Deleted

## 2013-12-19 ENCOUNTER — Inpatient Hospital Stay (HOSPITAL_COMMUNITY)
Admission: RE | Admit: 2013-12-19 | Payer: BC Managed Care – PPO | Source: Ambulatory Visit | Admitting: Obstetrics and Gynecology

## 2013-12-19 LAB — CBC
HEMATOCRIT: 28.5 % — AB (ref 36.0–46.0)
HEMOGLOBIN: 9.6 g/dL — AB (ref 12.0–15.0)
MCH: 30.1 pg (ref 26.0–34.0)
MCHC: 33.7 g/dL (ref 30.0–36.0)
MCV: 89.3 fL (ref 78.0–100.0)
Platelets: 169 10*3/uL (ref 150–400)
RBC: 3.19 MIL/uL — ABNORMAL LOW (ref 3.87–5.11)
RDW: 14 % (ref 11.5–15.5)
WBC: 15.4 10*3/uL — ABNORMAL HIGH (ref 4.0–10.5)

## 2013-12-19 LAB — RPR: RPR: NONREACTIVE

## 2013-12-19 SURGERY — Surgical Case
Anesthesia: Regional

## 2013-12-19 NOTE — Lactation Note (Signed)
This note was copied from the chart of Brandi Webster. Lactation Consultation Note  Patient Name: Brandi Harriette Oharamanda Laubacher ZOXWR'UToday's Date: 12/19/2013 Reason for consult: Initial assessment Mom stated breast fed great for first feeding after delivery. Small feedings d/t sleepy during the night. Had a good 30 minute feeding at 1130am. Attempted to BF prior to circumcision at 1300, wasn't interested. Baby just arrive from circumcision and very sleepy. Resource brochure given and discussed out pt. Services and support groups. Mom has Breast implants and Hx: of low milk supply w/previous baby along w/PIH,edema,and C-section. Delivered this baby VBACK w/o previous complications. Noted good compressible but small nipples, w/slight bruising to lower rim of Rt. Nipple. Demonstrated and teach back reverse pressure and hand expression. Optional positions discussed to obtain a deep latch. Mom is to stimulate baby in a few hrs. To wake for feedings and call for assistance to verify latch. Very receptive to education information.  Maternal Data Infant to breast within first hour of birth: Yes Has patient been taught Hand Expression?: Yes Does the patient have breastfeeding experience prior to this delivery?: Yes  Feeding Feeding Type: Breast Fed  LATCH Score/Interventions                      Lactation Tools Discussed/Used     Consult Status Consult Status: Follow-up Date: 12/20/13 Follow-up type: In-patient    Charyl DancerCARVER, Roverto Bodmer G 12/19/2013, 4:01 PM

## 2013-12-19 NOTE — Progress Notes (Signed)
Post Partum Day 1 Subjective: no complaints, voiding and tolerating PO, mother and baby are bonding well  Objective: Blood pressure 129/79, pulse 86, temperature 98.7 F (37.1 C), temperature source Oral, resp. rate 18, height 5\' 2"  (1.575 m), weight 78.926 kg (174 lb), last menstrual period 03/20/2013, unknown if currently breastfeeding.  Physical Exam:  General: alert, cooperative and no distress Lochia: appropriate Uterine Fundus: firm DVT Evaluation: No evidence of DVT seen on physical exam. Negative Homan's sign.   Recent Labs  12/18/13 1602 12/19/13 0634  HGB 11.2* 9.6*  HCT 33.9* 28.5*    Assessment/Plan:  Plan for discharge tomorrow Continue routine post partum care Circumcision planned for baby today   LOS: 1 day   Brandi Webster STACIA 12/19/2013, 9:00 AM

## 2013-12-19 NOTE — Anesthesia Postprocedure Evaluation (Signed)
  Anesthesia Post-op Note  Patient: Brandi Webster  Procedure(s) Performed: * No procedures listed *  Patient Location: Mother/Baby  Anesthesia Type:Epidural  Level of Consciousness: awake, alert , oriented and patient cooperative  Airway and Oxygen Therapy: Patient Spontanous Breathing  Post-op Pain: mild  Post-op Assessment: Patient's Cardiovascular Status Stable, Respiratory Function Stable, Patent Airway, No signs of Nausea or vomiting, Adequate PO intake and Pain level controlled  Post-op Vital Signs: Reviewed and stable  Complications: No apparent anesthesia complications

## 2013-12-20 ENCOUNTER — Other Ambulatory Visit (HOSPITAL_COMMUNITY): Payer: BC Managed Care – PPO

## 2013-12-20 NOTE — Progress Notes (Signed)
Patient is eating, ambulating, voiding.  Pain control is good.  Filed Vitals:   12/18/13 2330 12/19/13 0030 12/19/13 1009 12/19/13 1746  BP: 121/76 129/79 122/66 125/87  Pulse: 80 86 105 82  Temp: 98.4 F (36.9 C) 98.7 F (37.1 C) 98.3 F (36.8 C) 97.8 F (36.6 C)  TempSrc: Oral Oral Oral Oral  Resp: 18 18 18 18   Height:      Weight:        Fundus firm Perineum without swelling.  Lab Results  Component Value Date   WBC 15.4* 12/19/2013   HGB 9.6* 12/19/2013   HCT 28.5* 12/19/2013   MCV 89.3 12/19/2013   PLT 169 12/19/2013    --/--/O POS (03/02 1602)/RI  A/P Post partum day 2.  Routine care.  Expect d/c today.    Akash Winski A

## 2013-12-20 NOTE — Discharge Summary (Signed)
Obstetric Discharge Summary Reason for Admission: onset of labor Prenatal Procedures: none Intrapartum Procedures: spontaneous vaginal delivery Postpartum Procedures: none Complications-Operative and Postpartum: labial laceration Hemoglobin  Date Value Ref Range Status  12/19/2013 9.6* 12.0 - 15.0 g/dL Final  1/61/09608/26/2014 45.411.4   Final     HCT  Date Value Ref Range Status  12/19/2013 28.5* 36.0 - 46.0 % Final  06/13/2013 35   Final    Discharge Diagnoses: Term Pregnancy-delivered  Discharge Information: Date: 12/20/2013 Activity: pelvic rest Diet: routine Medications: Ibuprofen Condition: stable Instructions: refer to practice specific booklet Discharge to: home Follow-up Information   Follow up with CALLAHAN, SIDNEY, DO In 4 weeks.   Specialty:  Obstetrics and Gynecology   Contact information:   [address] [place] [address] [phone]       Newborn Data: Live born female  Birth Weight: 7 lb 1.4 oz (3215 g) APGAR: 9, 9  Home with mother.  Jamia Hoban A 12/20/2013, 5:54 AM

## 2013-12-20 NOTE — Progress Notes (Signed)
UR chart review completed.  

## 2013-12-23 ENCOUNTER — Encounter: Payer: Self-pay | Admitting: *Deleted

## 2014-01-31 ENCOUNTER — Other Ambulatory Visit: Payer: Self-pay

## 2014-08-20 ENCOUNTER — Encounter (HOSPITAL_COMMUNITY): Payer: Self-pay | Admitting: *Deleted

## 2014-09-19 ENCOUNTER — Encounter: Payer: Self-pay | Admitting: Family Medicine

## 2014-10-31 ENCOUNTER — Ambulatory Visit (INDEPENDENT_AMBULATORY_CARE_PROVIDER_SITE_OTHER): Payer: 59 | Admitting: Nurse Practitioner

## 2014-10-31 ENCOUNTER — Encounter: Payer: Self-pay | Admitting: Nurse Practitioner

## 2014-10-31 VITALS — BP 110/70 | Ht 62.5 in | Wt 127.0 lb

## 2014-10-31 DIAGNOSIS — Z111 Encounter for screening for respiratory tuberculosis: Secondary | ICD-10-CM

## 2014-10-31 DIAGNOSIS — R5383 Other fatigue: Secondary | ICD-10-CM

## 2014-11-02 LAB — TB SKIN TEST
Induration: 0 mm
TB Skin Test: NEGATIVE

## 2014-11-03 ENCOUNTER — Encounter: Payer: Self-pay | Admitting: Nurse Practitioner

## 2014-11-03 NOTE — Progress Notes (Signed)
Subjective:  Presents with paperwork for her new employer. Gets regular physicals. With her last job, patient getting minimal sleep, stays fatigued. Had routine labs through work which were good. 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  Objective:   BP 110/70 mmHg  Ht 5' 2.5" (1.588 m)  Wt 127 lb (57.607 kg)  BMI 22.84 kg/m2 NAD. Alert, oriented. Lungs clear. Heart RRR.   Assessment:Other fatigue  Screening examination for pulmonary tuberculosis - Plan: TB Skin Test  Plan: TB skin test as required by employer. Recheck as needed.

## 2014-12-14 ENCOUNTER — Encounter: Payer: Self-pay | Admitting: Family Medicine

## 2014-12-14 ENCOUNTER — Ambulatory Visit (INDEPENDENT_AMBULATORY_CARE_PROVIDER_SITE_OTHER): Payer: BLUE CROSS/BLUE SHIELD | Admitting: Family Medicine

## 2014-12-14 VITALS — Temp 98.1°F | Ht 62.5 in | Wt 130.0 lb

## 2014-12-14 DIAGNOSIS — J019 Acute sinusitis, unspecified: Secondary | ICD-10-CM | POA: Diagnosis not present

## 2014-12-14 DIAGNOSIS — B9689 Other specified bacterial agents as the cause of diseases classified elsewhere: Secondary | ICD-10-CM

## 2014-12-14 MED ORDER — AZITHROMYCIN 250 MG PO TABS: ORAL_TABLET | ORAL | Status: DC

## 2014-12-14 NOTE — Progress Notes (Signed)
   Subjective:    Patient ID: Brandi Webster, female    DOB: May 22, 1981, 34 y.o.   MRN: [id number]  Cough This is a new problem. Episode onset: 2 weeks ago. Associated symptoms include ear pain, a fever, headaches, nasal congestion, rhinorrhea and a sore throat. Pertinent negatives include no chest pain, shortness of breath or wheezing.   PMH benign   Review of Systems  Constitutional: Positive for fever. Negative for activity change.  HENT: Positive for congestion, ear pain, rhinorrhea and sore throat.   Eyes: Negative for discharge.  Respiratory: Positive for cough. Negative for shortness of breath and wheezing.   Cardiovascular: Negative for chest pain.  Neurological: Positive for headaches.       Objective:   Physical Exam  Constitutional: She appears well-developed.  HENT:  Head: Normocephalic.  Nose: Nose normal.  Mouth/Throat: Oropharynx is clear and moist. No oropharyngeal exudate.  Neck: Neck supple.  Cardiovascular: Normal rate and normal heart sounds.   No murmur heard. Pulmonary/Chest: Effort normal and breath sounds normal. She has no wheezes.  Lymphadenopathy:    She has no cervical adenopathy.  Skin: Skin is warm and dry.  Nursing note and vitals reviewed.  Mild laryngitis       Assessment & Plan:  Viral syndrome secondary sinus drainage present for the past couple weeks probable mild sinus infection Zithromax 5 days as directed warning signs discussed follow-up if ongoing

## 2016-01-03 ENCOUNTER — Other Ambulatory Visit: Payer: Self-pay | Admitting: Obstetrics and Gynecology

## 2016-01-06 LAB — CYTOLOGY - PAP

## 2016-02-28 ENCOUNTER — Encounter: Payer: Self-pay | Admitting: Nurse Practitioner

## 2016-02-28 ENCOUNTER — Ambulatory Visit (INDEPENDENT_AMBULATORY_CARE_PROVIDER_SITE_OTHER): Payer: BC Managed Care – PPO | Admitting: Nurse Practitioner

## 2016-02-28 VITALS — BP 110/76 | Temp 98.0°F | Ht 62.5 in | Wt 122.1 lb

## 2016-02-28 DIAGNOSIS — G43009 Migraine without aura, not intractable, without status migrainosus: Secondary | ICD-10-CM | POA: Diagnosis not present

## 2016-02-28 DIAGNOSIS — W57XXXA Bitten or stung by nonvenomous insect and other nonvenomous arthropods, initial encounter: Secondary | ICD-10-CM | POA: Diagnosis not present

## 2016-02-28 DIAGNOSIS — S30860A Insect bite (nonvenomous) of lower back and pelvis, initial encounter: Secondary | ICD-10-CM

## 2016-02-28 DIAGNOSIS — R5383 Other fatigue: Secondary | ICD-10-CM

## 2016-02-28 MED ORDER — DOXYCYCLINE HYCLATE 100 MG PO TABS: 100.0000 mg | ORAL_TABLET | Freq: Two times a day (BID) | ORAL | Status: DC

## 2016-03-02 ENCOUNTER — Other Ambulatory Visit: Payer: Self-pay | Admitting: Nurse Practitioner

## 2016-03-02 ENCOUNTER — Encounter: Payer: Self-pay | Admitting: Nurse Practitioner

## 2016-03-02 LAB — LIPID PANEL
CHOL/HDL RATIO: 2 ratio (ref <=5.0)
CHOLESTEROL: 145 mg/dL (ref 125–200)
HDL: 72 mg/dL (ref >=46)
LDL Cholesterol: 65 mg/dL (ref <130)
Triglycerides: 38 mg/dL (ref <150)
VLDL: 8 mg/dL (ref <30)

## 2016-03-02 LAB — BASIC METABOLIC PANEL
BUN: 16 mg/dL (ref 7–25)
CALCIUM: 8.9 mg/dL (ref 8.6–10.2)
CO2: 21 mmol/L (ref 20–31)
CREATININE: 0.77 mg/dL (ref 0.50–1.10)
Chloride: 107 mmol/L (ref 98–110)
GLUCOSE: 80 mg/dL (ref 65–99)
Potassium: 4.3 mmol/L (ref 3.5–5.3)
SODIUM: 136 mmol/L (ref 135–146)

## 2016-03-02 LAB — CBC WITH DIFFERENTIAL/PLATELET
BASOS ABS: 0 {cells}/uL (ref 0–200)
Basophils Relative: 0 %
EOS ABS: 122 {cells}/uL (ref 15–500)
EOS PCT: 2 %
HCT: 36.9 % (ref 35.0–45.0)
Hemoglobin: 12.4 g/dL (ref 11.7–15.5)
LYMPHS ABS: 1952 {cells}/uL (ref 850–3900)
Lymphocytes Relative: 32 %
MCH: 30.2 pg (ref 27.0–33.0)
MCHC: 33.6 g/dL (ref 32.0–36.0)
MCV: 89.8 fL (ref 80.0–100.0)
MPV: 11.1 fL (ref 7.5–12.5)
Monocytes Absolute: 366 cells/uL (ref 200–950)
Monocytes Relative: 6 %
NEUTROS PCT: 60 %
Neutro Abs: 3660 cells/uL (ref 1500–7800)
Platelets: 289 10*3/uL (ref 140–400)
RBC: 4.11 MIL/uL (ref 3.80–5.10)
RDW: 13 % (ref 11.0–15.0)
WBC: 6.1 10*3/uL (ref 3.8–10.8)

## 2016-03-02 LAB — HEPATIC FUNCTION PANEL
ALT: 13 U/L (ref 6–29)
AST: 16 U/L (ref 10–30)
Albumin: 3.8 g/dL (ref 3.6–5.1)
Alkaline Phosphatase: 62 U/L (ref 33–115)
BILIRUBIN DIRECT: 0.1 mg/dL (ref <=0.2)
BILIRUBIN INDIRECT: 0.5 mg/dL (ref 0.2–1.2)
Total Bilirubin: 0.6 mg/dL (ref 0.2–1.2)
Total Protein: 6.1 g/dL (ref 6.1–8.1)

## 2016-03-02 LAB — TSH: TSH: 1.5 mIU/L

## 2016-03-02 NOTE — Progress Notes (Signed)
Subjective:  Presents for complaints of fatigue over the past several days. States she feels "exhausted". Has had a migraine type headache for the past couple of days, describes as a pressure. This is the first one that she's had in 7 years. No change in symptomatology. No head congestion runny nose cough or sore throat. No fever. No nausea vomiting. Has had a tick bite recently, other than a slight pink area at the site of the bite, no rash has been noted. No visual changes. No difficulty speaking or swallowing. No numbness or weakness of the face arms or legs. Patient states she is due for routine lab work.  Objective:   BP 110/76 mmHg  Temp(Src) 98 F (36.7 C) (Oral)  Ht 5' 2.5" (1.588 m)  Wt 122 lb 2 oz (55.396 kg)  BMI 21.97 kg/m2 NAD. Alert, oriented. TMs minimal clear effusion, no erythema. Pharynx clear. Neck supple with minimal adenopathy. Lungs clear. Heart regular rate rhythm. Slight pink area noted at the site of the tick bite on the back area, no other rash is noted.  Assessment: Tick bite of back, initial encounter - Plan: Lipid panel, Hepatic function panel, Basic metabolic panel, CBC with Differential/Platelet, TSH, VITAMIN D 25 Hydroxy (Vit-D Deficiency, Fractures)  Other fatigue - Plan: Lipid panel, Hepatic function panel, Basic metabolic panel, CBC with Differential/Platelet, TSH, VITAMIN D 25 Hydroxy (Vit-D Deficiency, Fractures)  Migraine without aura and without status migrainosus, not intractable - Plan: Lipid panel, Hepatic function panel, Basic metabolic panel, CBC with Differential/Platelet, TSH, VITAMIN D 25 Hydroxy (Vit-D Deficiency, Fractures)  Plan:  Meds ordered this encounter  Medications  . doxycycline (VIBRA-TABS) 100 MG tablet    Sig: Take 1 tablet (100 mg total) by mouth 2 (two) times daily.    Dispense:  20 tablet    Refill:  0    Order Specific Question:  Supervising Provider    Answer:  Merlyn AlbertLUKING, WILLIAM S [[id number]]   As a precaution start doxycycline as  directed. Lab work pending. Warning signs reviewed. Further follow-up based on lab results, patient to call back sooner if any problems. Anti-inflammatories as directed for headache.

## 2016-03-03 LAB — VITAMIN D 25 HYDROXY (VIT D DEFICIENCY, FRACTURES): Vit D, 25-Hydroxy: 53 ng/mL (ref 30–100)

## 2016-03-04 ENCOUNTER — Encounter: Payer: Self-pay | Admitting: Nurse Practitioner

## 2016-03-06 ENCOUNTER — Other Ambulatory Visit: Payer: Self-pay | Admitting: Nurse Practitioner

## 2016-03-06 MED ORDER — FLUCONAZOLE 150 MG PO TABS: ORAL_TABLET | ORAL | Status: DC

## 2016-08-14 ENCOUNTER — Ambulatory Visit (INDEPENDENT_AMBULATORY_CARE_PROVIDER_SITE_OTHER): Payer: BC Managed Care – PPO | Admitting: Nurse Practitioner

## 2016-08-14 ENCOUNTER — Encounter: Payer: Self-pay | Admitting: Nurse Practitioner

## 2016-08-14 VITALS — BP 118/70 | Ht 62.5 in | Wt 119.1 lb

## 2016-08-14 DIAGNOSIS — L723 Sebaceous cyst: Secondary | ICD-10-CM

## 2016-08-16 ENCOUNTER — Encounter: Payer: Self-pay | Admitting: Nurse Practitioner

## 2016-08-16 NOTE — Progress Notes (Signed)
Subjective:  Presents for c/o a small cyst on the anterior front scalp for about a year. Non tender. Will get bigger at times but then goes back to original size. Minimal change.   Objective:   BP 118/70   Ht 5' 2.5" (1.588 m)   Wt 119 lb 2 oz (54 kg)   BMI 21.44 kg/m  Firm, mobile nonerythematous, non tender superficial cyst noted in the frontal area of the scalp.  Assessment: Sebaceous cyst - Plan: Ambulatory referral to Dermatology  Plan: Refer to skin surgery center per patient request.

## 2016-10-30 ENCOUNTER — Ambulatory Visit: Payer: BC Managed Care – PPO | Admitting: Nurse Practitioner

## 2017-04-02 ENCOUNTER — Other Ambulatory Visit: Payer: Self-pay | Admitting: Obstetrics and Gynecology

## 2017-04-06 LAB — CYTOLOGY - PAP

## 2017-07-21 ENCOUNTER — Encounter: Payer: Self-pay | Admitting: Nurse Practitioner

## 2017-07-21 ENCOUNTER — Ambulatory Visit (INDEPENDENT_AMBULATORY_CARE_PROVIDER_SITE_OTHER): Payer: BC Managed Care – PPO | Admitting: Nurse Practitioner

## 2017-07-21 VITALS — BP 110/78 | Temp 98.1°F | Ht 62.5 in | Wt 121.0 lb

## 2017-07-21 DIAGNOSIS — J181 Lobar pneumonia, unspecified organism: Secondary | ICD-10-CM

## 2017-07-21 DIAGNOSIS — J189 Pneumonia, unspecified organism: Secondary | ICD-10-CM

## 2017-07-21 DIAGNOSIS — R091 Pleurisy: Secondary | ICD-10-CM | POA: Diagnosis not present

## 2017-07-21 MED ORDER — LEVOFLOXACIN 500 MG PO TABS: 500.0000 mg | ORAL_TABLET | Freq: Every day | ORAL | 0 refills | Status: DC

## 2017-07-21 NOTE — Patient Instructions (Signed)
Continue Ibuprofen as directed Recheck Friday if needed

## 2017-07-22 ENCOUNTER — Encounter: Payer: Self-pay | Admitting: Nurse Practitioner

## 2017-07-22 NOTE — Progress Notes (Signed)
Subjective:  Presents for c/o chest pain for the past 2 days. Had an illness about 3 weeks ago. Was better for about 1 1/2 weeks. Began feeling bad again 9/28. Low grade fever on 9/29. Chest pain mainly with deep breath and movement. Non productive cough. Facial area headache. Ran on Monday. Slight SOB but no major issues. Had some mid chest pain yesterday going through to the back. Took Ibuprofen; symptoms completely resolved and "felt much better". Listened to her lungs last night. Heard a "fluttering" sound near the LLL. No current reflux symptoms. Denies pregnancy.   Objective:   BP 110/78   Temp 98.1 F (36.7 C) (Oral)   Ht 5' 2.5" (1.588 m)   Wt 121 lb (54.9 kg)   SpO2 96%   BMI 21.78 kg/m  NAD. Alert, oriented. TMs clear effusion. Pharynx injected with cloudy PND noted. Neck supple with mild anterior adenopathy. Lungs: distinct coarse expiratory crackles LLL. Otherwise clear. No wheezing. No tachypnea. LE: no edema.   Assessment:  Pneumonia of left lower lobe due to infectious organism [facility])  Pleurisy    Plan:   Meds ordered this encounter  Medications  . levofloxacin (LEVAQUIN) 500 MG tablet    Sig: Take 1 tablet (500 mg total) by mouth daily.    Dispense:  10 tablet    Refill:  0    Order Specific Question:   Supervising Provider    Answer:   Merlyn Albert [[id number]]   Recommended Rocephin injection but she defers since she needs to leave to pick up her child. Start levaquin as directed. OTC meds as directed for cough. Continue Ibuprofen as directed. Call back in 48 hours if no improvement, sooner if worse. Warning signs were reviewed.

## 2017-07-23 ENCOUNTER — Ambulatory Visit (INDEPENDENT_AMBULATORY_CARE_PROVIDER_SITE_OTHER): Payer: BC Managed Care – PPO | Admitting: Nurse Practitioner

## 2017-07-23 ENCOUNTER — Encounter: Payer: Self-pay | Admitting: Family Medicine

## 2017-07-23 ENCOUNTER — Ambulatory Visit: Payer: BC Managed Care – PPO | Admitting: Nurse Practitioner

## 2017-07-23 ENCOUNTER — Encounter: Payer: Self-pay | Admitting: Nurse Practitioner

## 2017-07-23 VITALS — BP 120/80 | Temp 98.4°F | Wt 120.2 lb

## 2017-07-23 DIAGNOSIS — J209 Acute bronchitis, unspecified: Secondary | ICD-10-CM

## 2017-07-23 MED ORDER — PREDNISONE 20 MG PO TABS: ORAL_TABLET | ORAL | 0 refills | Status: DC

## 2017-07-24 ENCOUNTER — Encounter: Payer: Self-pay | Admitting: Nurse Practitioner

## 2017-07-24 NOTE — Progress Notes (Signed)
Subjective:  Presents for recheck. See previous note. Energy level much improved. Headache and myalgias when first starting Levaquin but this has improved. No high fevers or neck stiffness. No further chest pain. No SOB. Still has some cough which is productive at times.   Objective:   BP 120/80   Temp 98.4 F (36.9 C) (Oral)   Wt 120 lb 4 oz (54.5 kg)   BMI 21.64 kg/m  NAD. Alert, oriented. TMs mild clear effusion. Pharynx clear. Neck supple with mild adenopathy. Lungs: scattered expiratory crackles, no wheezing or tachypnea. Crackles in LLL resolved. Heart RRR.  Assessment:  Acute bronchitis, unspecified organism    Plan:   Meds ordered this encounter  Medications  . ibuprofen (ADVIL,MOTRIN) 200 MG tablet    Sig: Take 200 mg by mouth every 4 (four) hours as needed.  . Pseudoephedrine-Guaifenesin (MUCINEX D PO)    Sig: Take by mouth.  . predniSONE (DELTASONE) 20 MG tablet    Sig: 3 po qd x 3 d then 2 po qd x 3 d then 1 po qd x 2 d    Dispense:  17 tablet    Refill:  0    Order Specific Question:   Supervising Provider    Answer:   Merlyn Albert [[id number]]   Complete Levaquin as directed. Add Prednisone to regimen.  Call back next week if no improvement, sooner if worse.

## 2017-11-10 ENCOUNTER — Telehealth: Payer: Self-pay | Admitting: Family

## 2017-11-10 DIAGNOSIS — R399 Unspecified symptoms and signs involving the genitourinary system: Secondary | ICD-10-CM

## 2017-11-10 MED ORDER — CEPHALEXIN 500 MG PO CAPS: 500.0000 mg | ORAL_CAPSULE | Freq: Two times a day (BID) | ORAL | 0 refills | Status: DC

## 2017-11-10 NOTE — Progress Notes (Signed)

## 2019-10-06 ENCOUNTER — Encounter: Payer: Self-pay | Admitting: Nurse Practitioner

## 2019-10-23 DIAGNOSIS — N390 Urinary tract infection, site not specified: Secondary | ICD-10-CM | POA: Diagnosis not present

## 2019-11-23 DIAGNOSIS — Z01419 Encounter for gynecological examination (general) (routine) without abnormal findings: Secondary | ICD-10-CM | POA: Diagnosis not present

## 2019-11-23 DIAGNOSIS — Z124 Encounter for screening for malignant neoplasm of cervix: Secondary | ICD-10-CM | POA: Diagnosis not present

## 2019-12-22 ENCOUNTER — Other Ambulatory Visit: Payer: Self-pay

## 2019-12-22 ENCOUNTER — Encounter: Payer: Self-pay | Admitting: Nurse Practitioner

## 2019-12-22 ENCOUNTER — Ambulatory Visit (INDEPENDENT_AMBULATORY_CARE_PROVIDER_SITE_OTHER): Payer: 59 | Admitting: Nurse Practitioner

## 2019-12-22 VITALS — BP 110/74 | Temp 97.3°F | Ht 62.0 in | Wt 122.0 lb

## 2019-12-22 DIAGNOSIS — R69 Illness, unspecified: Secondary | ICD-10-CM | POA: Diagnosis not present

## 2019-12-22 DIAGNOSIS — M778 Other enthesopathies, not elsewhere classified: Secondary | ICD-10-CM

## 2019-12-22 DIAGNOSIS — M62838 Other muscle spasm: Secondary | ICD-10-CM

## 2019-12-22 DIAGNOSIS — F430 Acute stress reaction: Secondary | ICD-10-CM

## 2019-12-22 DIAGNOSIS — Z13220 Encounter for screening for lipoid disorders: Secondary | ICD-10-CM

## 2019-12-22 DIAGNOSIS — R5383 Other fatigue: Secondary | ICD-10-CM | POA: Diagnosis not present

## 2019-12-22 DIAGNOSIS — F411 Generalized anxiety disorder: Secondary | ICD-10-CM

## 2019-12-22 MED ORDER — ESCITALOPRAM OXALATE 10 MG PO TABS: 10.0000 mg | ORAL_TABLET | Freq: Every day | ORAL | 0 refills | Status: DC

## 2019-12-22 NOTE — Progress Notes (Signed)
Subjective:    Patient ID: Brandi Webster, female    DOB: 05/31/1981, 39 y.o.   MRN: [id number]  HPI Presents for follow up visit with complaints of intermittent bilateral wrist discomfort, pain scale 4/10 with onset of 2 years. Characterizes as tingling in the palm and wrist with right wrist being more bothersome. States increased discomfort at night with conservative measures of Aleve and braces being effective. Slight weakness in both wrists with lifting heavy objects. Some neck tightness and stiffness especially with stress.  Discussed recent family trauma and loss of brother and mother-in-law within the last year. Denies suicidal idealizations. Defers pneumonia and flu vaccines and has routine visual and dental exams. Denies alcohol, tobacco and illicit drug use and states she participates in daily exercise routine and follows a low carb diet. Married with same sex partner. Husband has had vasectomy and states routine menses with regular flow. Gets regular physicals with gynecology.    Review of Systems  Constitutional: Negative for activity change, appetite change and fatigue.  HENT: Negative for congestion, ear pain and rhinorrhea.   Respiratory: Negative for cough, chest tightness and shortness of breath.   Cardiovascular: Negative for chest pain, palpitations and leg swelling.  Gastrointestinal: Negative for abdominal pain, constipation, diarrhea and nausea.  Genitourinary: Negative for difficulty urinating, dysuria, frequency, hematuria, menstrual problem and urgency.  Musculoskeletal: Positive for neck stiffness. Negative for back pain and joint swelling.  Psychiatric/Behavioral: Negative for sleep disturbance and suicidal ideas.      Depression screen [facility] 2/9 12/22/2019 12/22/2019  Decreased Interest 0 0  Down, Depressed, Hopeless 1 0  PHQ - 2 Score 1 0  Altered sleeping 0 -  Tired, decreased energy 1 -  Change in appetite 0 -  Feeling bad or failure about yourself  1 -  Trouble  concentrating 0 -  Moving slowly or fidgety/restless 0 -  Suicidal thoughts 0 -  PHQ-9 Score 3 -  Difficult doing work/chores Not difficult at all -   GAD 7 : Generalized Anxiety Score 12/22/2019  Nervous, Anxious, on Edge 1  Control/stop worrying 0  Worry too much - different things 0  Trouble relaxing 1  Restless 0  Easily annoyed or irritable 1  Afraid - awful might happen 2  Total GAD 7 Score 5  Anxiety Difficulty Somewhat difficult        Objective:   Physical Exam Constitutional:      Appearance: Normal appearance.  HENT:     Right Ear: Tympanic membrane and ear canal normal.     Left Ear: Tympanic membrane and ear canal normal.  Cardiovascular:     Rate and Rhythm: Normal rate and regular rhythm.     Pulses:          Radial pulses are 2+ on the right side and 2+ on the left side.     Heart sounds: Normal heart sounds, S1 normal and S2 normal.  Pulmonary:     Effort: Pulmonary effort is normal.     Breath sounds: Normal breath sounds.  Abdominal:     General: Abdomen is flat.     Palpations: Abdomen is soft.     Tenderness: There is no abdominal tenderness.  Neurological:     Mental Status: She is alert.  Psychiatric:        Attention and Perception: Attention normal.        Mood and Affect: Mood is anxious. Affect is tearful.        Behavior:  Behavior normal.        Thought Content: Thought content normal.        Judgment: Judgment normal.      MSK: bilateral tight, tender muscles along the trapezius into the rhomboids, greater on the right.   Neuro: Bilateral hand strength 5/5. Negative Phalen's test. Negative Tinel's test. Sensation grossly intact. Normal ROM of the neck.   Psych: Attention is normal and mood anxious and tearful throughout discussion.          Assessment & Plan:   Problem List Items Addressed This Visit      Other   Anxiety as acute reaction to exceptional stress   Relevant Medications   escitalopram (LEXAPRO) 10 MG tablet      Other Visit Diagnoses    Wrist tendonitis    -  Primary   Fatigue, unspecified type       Relevant Orders   CBC with Differential/Platelet   Basic metabolic panel   TSH   VITAMIN D 25 Hydroxy (Vit-D Deficiency, Fractures)   Hepatic function panel   Muscle spasms of neck       Screening for lipid disorders       Relevant Orders   Lipid panel     Meds ordered this encounter  Medications  . escitalopram (LEXAPRO) 10 MG tablet    Sig: Take 1 tablet (10 mg total) by mouth daily.    Dispense:  30 tablet    Refill:  0    Order Specific Question:   Supervising Provider    Answer:   Kathyrn Drown [phone]    Labs pending Education provided on potential adverse reactions of Lexapro. If adverse reactions occur to discontinue and call the office.  Education provided on continuation of conservative measures related to wrist discomfort. Continue brace application and OTC anti-inflammatory medications. Given Rx for massage therapy for her neck.  Discussed stress reduction.   Follow up in one month. Call back sooner if any problems.  25 minutes was spent with the patient.  This statement verifies that 25 minutes was indeed spent with the patient.  More than 50% of this visit-total duration of the visit-was spent in counseling and coordination of care. The issues that the patient came in for today as reflected in the diagnosis (s) please refer to documentation for further details.

## 2019-12-22 NOTE — Progress Notes (Signed)
   Subjective:    Patient ID: Brandi Webster, female    DOB: Aug 13, 1981, 39 y.o.   MRN: [id number]  HPI The patient comes in today for a wellness visit.    A review of their health history was completed.  A review of medications was also completed.  Any needed refills; not taking any meds  Eating habits: health conscious  Falls/  MVA accidents in past few months: none  Regular exercise: yes  Specialist pt sees on regular basis: gyn for pelvic and breast exams  Preventative health issues were discussed.   Additional concerns: right hand pain and numbness in right elbow. Does have carpel tunnel and wears a brace    Review of Systems     Objective:   Physical Exam        Assessment & Plan:

## 2019-12-28 DIAGNOSIS — R5383 Other fatigue: Secondary | ICD-10-CM | POA: Diagnosis not present

## 2019-12-28 DIAGNOSIS — Z13220 Encounter for screening for lipoid disorders: Secondary | ICD-10-CM | POA: Diagnosis not present

## 2019-12-29 LAB — CBC WITH DIFFERENTIAL/PLATELET
Basophils Absolute: 0 10*3/uL (ref 0.0–0.2)
Basos: 1 %
EOS (ABSOLUTE): 0.2 10*3/uL (ref 0.0–0.4)
Eos: 3 %
Hematocrit: 39.7 % (ref 34.0–46.6)
Hemoglobin: 13.6 g/dL (ref 11.1–15.9)
Immature Grans (Abs): 0 10*3/uL (ref 0.0–0.1)
Immature Granulocytes: 0 %
Lymphocytes Absolute: 1.7 10*3/uL (ref 0.7–3.1)
Lymphs: 27 %
MCH: 31.2 pg (ref 26.6–33.0)
MCHC: 34.3 g/dL (ref 31.5–35.7)
MCV: 91 fL (ref 79–97)
Monocytes Absolute: 0.4 10*3/uL (ref 0.1–0.9)
Monocytes: 6 %
Neutrophils Absolute: 4.1 10*3/uL (ref 1.4–7.0)
Neutrophils: 63 %
Platelets: 294 10*3/uL (ref 150–450)
RBC: 4.36 x10E6/uL (ref 3.77–5.28)
RDW: 11.5 % — ABNORMAL LOW (ref 11.7–15.4)
WBC: 6.4 10*3/uL (ref 3.4–10.8)

## 2019-12-29 LAB — LIPID PANEL
Chol/HDL Ratio: 2.3 ratio (ref 0.0–4.4)
Cholesterol, Total: 176 mg/dL (ref 100–199)
HDL: 77 mg/dL (ref >39)
LDL Chol Calc (NIH): 89 mg/dL (ref 0–99)
Triglycerides: 47 mg/dL (ref 0–149)
VLDL Cholesterol Cal: 10 mg/dL (ref 5–40)

## 2019-12-29 LAB — VITAMIN D 25 HYDROXY (VIT D DEFICIENCY, FRACTURES): Vit D, 25-Hydroxy: 43.4 ng/mL (ref 30.0–100.0)

## 2019-12-29 LAB — HEPATIC FUNCTION PANEL
ALT: 18 IU/L (ref 0–32)
AST: 22 IU/L (ref 0–40)
Albumin: 4.4 g/dL (ref 3.8–4.8)
Alkaline Phosphatase: 73 IU/L (ref 39–117)
Bilirubin Total: 0.5 mg/dL (ref 0.0–1.2)
Bilirubin, Direct: 0.12 mg/dL (ref 0.00–0.40)
Total Protein: 7 g/dL (ref 6.0–8.5)

## 2019-12-29 LAB — BASIC METABOLIC PANEL
BUN/Creatinine Ratio: 18 (ref 9–23)
BUN: 14 mg/dL (ref 6–20)
CO2: 21 mmol/L (ref 20–29)
Calcium: 9.6 mg/dL (ref 8.7–10.2)
Chloride: 104 mmol/L (ref 96–106)
Creatinine, Ser: 0.8 mg/dL (ref 0.57–1.00)
GFR calc Af Amer: 108 mL/min/{1.73_m2} (ref >59)
GFR calc non Af Amer: 94 mL/min/{1.73_m2} (ref >59)
Glucose: 86 mg/dL (ref 65–99)
Potassium: 4.7 mmol/L (ref 3.5–5.2)
Sodium: 139 mmol/L (ref 134–144)

## 2019-12-29 LAB — TSH: TSH: 1.62 u[IU]/mL (ref 0.450–4.500)

## 2020-02-26 ENCOUNTER — Encounter: Payer: Self-pay | Admitting: Nurse Practitioner

## 2020-02-27 ENCOUNTER — Other Ambulatory Visit: Payer: Self-pay

## 2020-02-27 ENCOUNTER — Ambulatory Visit: Payer: 59 | Attending: Internal Medicine

## 2020-02-27 DIAGNOSIS — Z20822 Contact with and (suspected) exposure to covid-19: Secondary | ICD-10-CM | POA: Insufficient documentation

## 2020-02-28 ENCOUNTER — Ambulatory Visit: Payer: 59

## 2020-02-28 ENCOUNTER — Other Ambulatory Visit: Payer: Self-pay

## 2020-02-28 DIAGNOSIS — Z20822 Contact with and (suspected) exposure to covid-19: Secondary | ICD-10-CM

## 2020-02-29 LAB — SARS-COV-2, NAA 2 DAY TAT

## 2020-02-29 LAB — NOVEL CORONAVIRUS, NAA: SARS-CoV-2, NAA: NOT DETECTED

## 2020-04-11 DIAGNOSIS — Z23 Encounter for immunization: Secondary | ICD-10-CM | POA: Diagnosis not present

## 2020-04-13 DIAGNOSIS — N390 Urinary tract infection, site not specified: Secondary | ICD-10-CM | POA: Diagnosis not present

## 2020-05-10 DIAGNOSIS — Z23 Encounter for immunization: Secondary | ICD-10-CM | POA: Diagnosis not present

## 2020-08-21 ENCOUNTER — Other Ambulatory Visit: Payer: Self-pay

## 2020-08-21 ENCOUNTER — Encounter: Payer: Self-pay | Admitting: Emergency Medicine

## 2020-08-21 ENCOUNTER — Ambulatory Visit
Admission: EM | Admit: 2020-08-21 | Discharge: 2020-08-21 | Disposition: A | Discharge status: Home / Self Care | Payer: 59 | Attending: Emergency Medicine | Admitting: Emergency Medicine

## 2020-08-21 ENCOUNTER — Ambulatory Visit (INDEPENDENT_AMBULATORY_CARE_PROVIDER_SITE_OTHER): Payer: 59

## 2020-08-21 DIAGNOSIS — S92102A Unspecified fracture of left talus, initial encounter for closed fracture: Secondary | ICD-10-CM | POA: Diagnosis not present

## 2020-08-21 DIAGNOSIS — M79672 Pain in left foot: Secondary | ICD-10-CM | POA: Diagnosis not present

## 2020-08-21 DIAGNOSIS — M25572 Pain in left ankle and joints of left foot: Secondary | ICD-10-CM | POA: Diagnosis not present

## 2020-08-21 DIAGNOSIS — M7732 Calcaneal spur, left foot: Secondary | ICD-10-CM | POA: Diagnosis not present

## 2020-08-21 MED ORDER — HYDROCODONE-ACETAMINOPHEN 5-325 MG PO TABS
1.0000 | ORAL_TABLET | Freq: Two times a day (BID) | ORAL | 0 refills | Status: DC | PRN
Start: 2020-08-21 — End: 2021-03-25

## 2020-08-21 NOTE — Discharge Instructions (Signed)
Continue conservative management of rest, ice, and elevation Cam walker and crutches given Remain non-weight-bearing until cleared/ evaluated by orthopedist Alternate ibuprofen and tylenol Norco for severe break-through pain. 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

## 2020-08-21 NOTE — ED Triage Notes (Signed)
LT foot pain after it bent back while playing with her son on the playground today

## 2020-08-21 NOTE — ED Provider Notes (Signed)
[facility]   [id number] 08/21/20 Arrival Time: 1659  CC:LT foot PAIN  SUBJECTIVE: History from: patient. Brandi Webster is a 39 y.o. female complains of LT foot pain that began today.  Symptoms began after her foot bent back while playing with her son at the playground.  Localizes the pain to the top of foot.  Describes the pain as intermittent and sharp in character.  Has tried icing.  Symptoms are made worse with walking and bearing weight.  Denies similar symptoms in the past.  Reports associated numbness/ tingling in foot.  Denies fever, chills, erythema, ecchymosis, effusion, weakness.  ROS: As per HPI.  All other pertinent ROS negative.     Past Medical History:  Diagnosis Date  . Headache(784.0)    otc med prn  . Heart murmur   . Heartburn in pregnancy   . PONV (postoperative nausea and vomiting)   . Pregnancy induced hypertension    first preg  . Preterm labor    2nd preg  . Vaginal Pap smear, abnormal    colpo, ok since   Past Surgical History:  Procedure Laterality Date  . BREAST SURGERY     augmentation  . CESAREAN SECTION  2009  . TONSILLECTOMY    . WISDOM TOOTH EXTRACTION     Allergies  Allergen Reactions  . Codeine Nausea And Vomiting   No current facility-administered medications on file prior to encounter.   Current Outpatient Medications on File Prior to Encounter  Medication Sig Dispense Refill  . escitalopram (LEXAPRO) 10 MG tablet Take 1 tablet (10 mg total) by mouth daily. 30 tablet 0   Social History   Socioeconomic History  . Marital status: Married    Spouse name: Not on file  . Number of children: Not on file  . Years of education: Not on file  . Highest education level: Not on file  Occupational History  . Not on file  Tobacco Use  . Smoking status: Never Smoker  . Smokeless tobacco: Never Used  Substance and Sexual Activity  . Alcohol use: No  . Drug use: No  . Sexual activity: Yes    Birth control/protection: None     Comment: pregnant  Other Topics Concern  . Not on file  Social History Narrative  . Not on file   Social Determinants of Health   Financial Resource Strain:   . Difficulty of Paying Living Expenses: Not on file  Food Insecurity:   . Worried About Programme researcher, broadcasting/film/video in the Last Year: Not on file  . Ran Out of Food in the Last Year: Not on file  Transportation Needs:   . Lack of Transportation (Medical): Not on file  . Lack of Transportation (Non-Medical): Not on file  Physical Activity:   . Days of Exercise per Week: Not on file  . Minutes of Exercise per Session: Not on file  Stress:   . Feeling of Stress : Not on file  Social Connections:   . Frequency of Communication with Friends and Family: Not on file  . Frequency of Social Gatherings with Friends and Family: Not on file  . Attends Religious Services: Not on file  . Active Member of Clubs or Organizations: Not on file  . Attends Banker Meetings: Not on file  . Marital Status: Not on file  Intimate Partner Violence:   . Fear of Current or Ex-Partner: Not on file  . Emotionally Abused: Not on file  . Physically Abused:  Not on file  . Sexually Abused: Not on file   Family History  Problem Relation Age of Onset  . Diabetes Maternal Grandmother   . Heart disease Maternal Grandmother   . Hearing loss Neg Hx     OBJECTIVE:  Vitals:   08/21/20 1708 08/21/20 1709  BP: 138/89   Pulse: 76   Resp: 18   Temp: 98.4 F (36.9 C)   TempSrc: Oral   SpO2: 98%   Weight:  117 lb (53.1 kg)  Height:  5\' 2"  (1.575 m)    General appearance: ALERT; in no acute distress.  Head: NCAT Lungs: Normal respiratory effort CV: Posterior tibialis pulse 2+ Musculoskeletal: LT foot Inspection: Skin warm, dry, clear and intact without obvious erythema, effusion, or ecchymosis.  Palpation: TTP over proximal dorsal aspect of foot ROM: LROM Strength: deferred Skin: warm and dry Neurologic: Ambulates without difficulty;  Sensation intact about the lower extremities Psychological: alert and cooperative; normal mood and affect  DIAGNOSTIC STUDIES:  DG Foot Complete Left  Result Date: 08/21/2020 CLINICAL DATA:  Left foot pain after injury playing with son on the playground today. EXAM: LEFT FOOT - COMPLETE 3+ VIEW COMPARISON:  None. FINDINGS: Curvilinear lucency involving the dorsal talus is indeterminate for artifact versus nondisplaced fracture. No other fracture of the foot. Tiny plantar calcaneal spur. The alignment and joint spaces are normal. There may be mild dorsal soft tissue edema. IMPRESSION: 1. Mild curvilinear lucency involving the dorsal talus may represent a nondisplaced fracture versus artifact. Recommend correlation for focal tenderness. 2. Tiny plantar calcaneal spur. Electronically Signed   By: 13/12/2019 M.D.   On: 08/21/2020 17:38    I have reviewed the x-rays myself and the radiologist interpretation. I am in agreement with the radiologist interpretation.     ASSESSMENT & PLAN:  1. Pain of joint of left ankle and foot   2. Closed nondisplaced fracture of left talus, unspecified portion of talus, initial encounter    Meds ordered this encounter  Medications  . HYDROcodone-acetaminophen (NORCO/VICODIN) 5-325 MG tablet    Sig: Take 1 tablet by mouth every 12 (twelve) hours as needed.    Dispense:  10 tablet    Refill:  0    Order Specific Question:   Supervising Provider    Answer:   13/12/2019 Eustace Moore   Continue conservative management of rest, ice, and elevation Cam walker and crutches given Remain non-weight-bearing until cleared/ evaluated by orthopedist Alternate ibuprofen and tylenol Norco for severe break-through pain. DO NOT TAKE PRIOR TO DRIVING OR OPERATING HEAVY MACHINERY Follow up with orthopedist for further evaluation and management Return or go to the ER if you have any new or worsening symptoms (fever, chills, chest pain, redness, swelling, bruising,  deformity, etc...)   Reviewed expectations re: course of current medical issues. Questions answered. Outlined signs and symptoms indicating need for more acute intervention. Patient verbalized understanding. After Visit Summary given.    [[id number]], PA-C 08/21/20 1748

## 2020-08-22 DIAGNOSIS — S93492A Sprain of other ligament of left ankle, initial encounter: Secondary | ICD-10-CM | POA: Diagnosis not present

## 2020-09-05 DIAGNOSIS — D225 Melanocytic nevi of trunk: Secondary | ICD-10-CM | POA: Diagnosis not present

## 2020-09-05 DIAGNOSIS — Z1283 Encounter for screening for malignant neoplasm of skin: Secondary | ICD-10-CM | POA: Diagnosis not present

## 2020-11-01 ENCOUNTER — Encounter: Payer: Self-pay | Admitting: Nurse Practitioner

## 2020-11-01 ENCOUNTER — Telehealth: Payer: 59 | Admitting: Family

## 2020-11-01 DIAGNOSIS — U071 COVID-19: Secondary | ICD-10-CM

## 2020-11-01 MED ORDER — PREDNISONE 10 MG (21) PO TBPK: ORAL_TABLET | ORAL | 0 refills | Status: DC

## 2020-11-01 MED ORDER — PROMETHAZINE-DM 6.25-15 MG/5ML PO SYRP: 5.0000 mL | ORAL_SOLUTION | Freq: Four times a day (QID) | ORAL | 0 refills | Status: DC | PRN

## 2020-11-01 NOTE — Progress Notes (Signed)
E-Visit for Corona Virus Screening  We are sorry you are not feeling well. We are here to help!  You have tested positive for COVID-19, meaning that you were infected with the novel coronavirus and could give the virus to others.  It is vitally important that you stay home so you do not spread it to others.      Please continue isolation at home, for at least 10 days since the start of your symptoms and until you have had 24 hours with no fever (without taking a fever reducer) and with improving of symptoms.  If you have no symptoms but tested positive (or all symptoms resolve after 5 days and you have no fever) you can leave your house but continue to wear a mask around others for an additional 5 days. If you have a fever,continue to stay home until you have had 24 hours of no fever. Most cases improve 5-10 days from onset but we have seen a small number of patients who have gotten worse after the 10 days.  Please be sure to watch for worsening symptoms and remain taking the proper precautions.   Go to the nearest hospital ED for assessment if fever/cough/breathlessness are severe or illness seems like a threat to life.    The following symptoms may appear 2-14 days after exposure: . Fever . Cough . Shortness of breath or difficulty breathing . Chills . Repeated shaking with chills . Muscle pain . Headache . Sore throat . New loss of taste or smell . Fatigue . Congestion or runny nose . Nausea or vomiting . Diarrhea  You have been enrolled in [facility] Monitoring for COVID-19. Daily you will receive a questionnaire within the MyChart website. Our COVID-19 response team will be monitoring your responses daily.  You can use medication such as A prescription cough medication called Phenergan DM 6.25 mg/15 mg. You make take one teaspoon / 5 ml every 4-6 hours as needed for cough  I also sent a prescription for Prednisone to help   You may also take acetaminophen (Tylenol) as needed  for fever.  HOME CARE: . Only take medications as instructed by your medical team. . Drink plenty of fluids and get plenty of rest. . A steam or ultrasonic humidifier can help if you have congestion.   GET HELP RIGHT AWAY IF YOU HAVE EMERGENCY WARNING SIGNS.  Call 911 or proceed to your closest emergency facility if: . You develop worsening high fever. . Trouble breathing . Bluish lips or face . Persistent pain or pressure in the chest . New confusion . Inability to wake or stay awake . You cough up blood. . Your symptoms become more severe . Inability to hold down food or fluids  This list is not all possible symptoms. Contact your medical provider for any symptoms that are severe or concerning to you.    Your e-visit answers were reviewed by a board certified advanced clinical practitioner to complete your personal care plan.  Depending on the condition, your plan could have included both over the counter or prescription medications.  If there is a problem please reply once you have received a response from your provider.  Your safety is important to [place].  If you have drug allergies check your prescription carefully.    You can use MyChart to ask questions about today's visit, request a non-urgent call back, or ask for a work or school excuse for 24 hours related to this e-Visit. If it has  been greater than 24 hours you will need to follow up with your provider, or enter a new e-Visit to address those concerns. You will get an e-mail in the next two days asking about your experience.  I hope that your e-visit has been valuable and will speed your recovery. Thank you for using e-visits.     Greater than 5 minutes, yet less than 10 minutes of time have been spent researching, coordinating, and implementing care for this patient today.  Thank you for the details you included in the comment boxes. Those details are very helpful in determining the best course of treatment for you and  help [place] to provide the best care.

## 2021-03-25 ENCOUNTER — Other Ambulatory Visit: Payer: Self-pay

## 2021-03-25 ENCOUNTER — Ambulatory Visit (INDEPENDENT_AMBULATORY_CARE_PROVIDER_SITE_OTHER): Payer: Managed Care, Other (non HMO) | Admitting: Family Medicine

## 2021-03-25 ENCOUNTER — Ambulatory Visit (HOSPITAL_COMMUNITY)
Admission: RE | Admit: 2021-03-25 | Discharge: 2021-03-25 | Disposition: A | Discharge status: Home / Self Care | Payer: Managed Care, Other (non HMO) | Source: Ambulatory Visit | Attending: Family Medicine | Admitting: Family Medicine

## 2021-03-25 ENCOUNTER — Encounter: Payer: Self-pay | Admitting: Family Medicine

## 2021-03-25 VITALS — HR 103 | Temp 99.9°F | Ht 62.0 in | Wt 120.0 lb

## 2021-03-25 DIAGNOSIS — J40 Bronchitis, not specified as acute or chronic: Secondary | ICD-10-CM | POA: Diagnosis present

## 2021-03-25 MED ORDER — PROMETHAZINE-DM 6.25-15 MG/5ML PO SYRP: 5.0000 mL | ORAL_SOLUTION | Freq: Four times a day (QID) | ORAL | 0 refills | Status: DC | PRN

## 2021-03-25 MED ORDER — AZITHROMYCIN 250 MG PO TABS: ORAL_TABLET | ORAL | 0 refills | Status: AC

## 2021-03-25 NOTE — Progress Notes (Signed)
Patient ID: Brandi Webster, female    DOB: 01-May-1981, 40 y.o.   MRN: [id number]   Chief Complaint  Patient presents with   Cough   Subjective:    HPI CC-cough, congestion, headache, achy, fever, chest tightness.  started 7 days. Did home covid test at home 36 hours after symptoms started and it was negative. Tried theraflu, sudafed, tylenol cold and sinus, zarbees. Had covid this past January.   Dec energy, sore throat, ear pain, hoarseness.   Had some sob and chest tightness occ. Had thick green sputum coughed up once.  Used theraflu, sudafed, tylenol cold/sinus, and zarbees cough syrup.  Medical History Brandi Webster has a past medical history of Headache(784.0), Heart murmur, Heartburn in pregnancy, PONV (postoperative nausea and vomiting), Pregnancy induced hypertension, Preterm labor, and Vaginal Pap smear, abnormal.   Outpatient Encounter Medications as of 03/25/2021  Medication Sig   azithromycin (ZITHROMAX) 250 MG tablet Take 2 tablets on day 1, then 1 tablet daily on days 2 through 5   promethazine-dextromethorphan (PROMETHAZINE-DM) 6.25-15 MG/5ML syrup Take 5 mLs by mouth 4 (four) times daily as needed for cough.   [DISCONTINUED] escitalopram (LEXAPRO) 10 MG tablet Take 1 tablet (10 mg total) by mouth daily.   [DISCONTINUED] HYDROcodone-acetaminophen (NORCO/VICODIN) 5-325 MG tablet Take 1 tablet by mouth every 12 (twelve) hours as needed.   [DISCONTINUED] predniSONE (STERAPRED UNI-PAK 21 TAB) 10 MG (21) TBPK tablet As directed   [DISCONTINUED] promethazine-dextromethorphan (PROMETHAZINE-DM) 6.25-15 MG/5ML syrup Take 5 mLs by mouth 4 (four) times daily as needed.   No facility-administered encounter medications on file as of 03/25/2021.     Review of Systems  Constitutional:  Positive for fatigue and fever. Negative for chills.  HENT:  Positive for congestion, ear pain, sore throat and voice change. Negative for rhinorrhea, sinus pressure and sinus pain.   Eyes:  Negative for  pain, discharge and itching.  Respiratory:  Positive for cough, chest tightness and shortness of breath.   Gastrointestinal:  Negative for constipation, diarrhea, nausea and vomiting.  Musculoskeletal:  Positive for myalgias.  Neurological:  Positive for headaches.    Vitals Pulse (!) 103   Temp 99.9 F (37.7 C)   Ht 5\' 2"  (1.575 m)   Wt 120 lb (54.4 kg)   SpO2 98%   BMI 21.95 kg/m   Objective:   Physical Exam Vitals and nursing note reviewed.  Constitutional:      General: She is not in acute distress.    Appearance: Normal appearance. She is not ill-appearing or toxic-appearing.  HENT:     Head: Normocephalic and atraumatic.     Right Ear: Tympanic membrane, ear canal and external ear normal.     Left Ear: Tympanic membrane, ear canal and external ear normal.     Nose: Nose normal. No congestion or rhinorrhea.     Mouth/Throat:     Mouth: Mucous membranes are moist.     Pharynx: Oropharynx is clear. No oropharyngeal exudate or posterior oropharyngeal erythema.  Eyes:     Extraocular Movements: Extraocular movements intact.     Conjunctiva/sclera: Conjunctivae normal.     Pupils: Pupils are equal, round, and reactive to light.  Cardiovascular:     Rate and Rhythm: Regular rhythm. Tachycardia present.     Pulses: Normal pulses.     Heart sounds: Normal heart sounds. No murmur heard. Pulmonary:     Effort: Pulmonary effort is normal. No respiratory distress.     Breath sounds: Normal breath sounds. No wheezing,  rhonchi or rales.  Musculoskeletal:     Cervical back: Normal range of motion.  Lymphadenopathy:     Cervical: No cervical adenopathy.  Skin:    General: Skin is warm and dry.     Findings: No rash.  Neurological:     Mental Status: She is alert and oriented to person, place, and time.  Psychiatric:        Mood and Affect: Mood normal.        Behavior: Behavior normal.     Assessment and Plan   1. Bronchitis - DG Chest 2 View; Future - azithromycin  (ZITHROMAX) 250 MG tablet; Take 2 tablets on day 1, then 1 tablet daily on days 2 through 5  Dispense: 6 tablet; Refill: 0 - promethazine-dextromethorphan (PROMETHAZINE-DM) 6.25-15 MG/5ML syrup; Take 5 mLs by mouth 4 (four) times daily as needed for cough.  Dispense: 118 mL; Refill: 0   Likely viral syndrome.  However, with severe coughing and sob and chest tightness will give abx.  Pt given azithromycin.  Ordered chest xray.  And cough syrup to use prn.  Increase fluids and tylenol or ibuprofen prn. Call or rto if not improving in 2-3 days or worsening with sob.    Return if symptoms worsen or fail to improve.  03/25/2021

## 2021-06-19 ENCOUNTER — Other Ambulatory Visit: Payer: Self-pay | Admitting: Obstetrics and Gynecology

## 2021-06-19 DIAGNOSIS — N644 Mastodynia: Secondary | ICD-10-CM

## 2021-07-10 ENCOUNTER — Ambulatory Visit
Admission: RE | Admit: 2021-07-10 | Discharge: 2021-07-10 | Disposition: A | Discharge status: Home / Self Care | Payer: 59 | Source: Ambulatory Visit | Attending: Obstetrics and Gynecology | Admitting: Obstetrics and Gynecology

## 2021-07-10 ENCOUNTER — Ambulatory Visit
Admission: RE | Admit: 2021-07-10 | Discharge: 2021-07-10 | Disposition: A | Discharge status: Home / Self Care | Payer: Managed Care, Other (non HMO) | Source: Ambulatory Visit | Attending: Obstetrics and Gynecology | Admitting: Obstetrics and Gynecology

## 2021-07-10 ENCOUNTER — Other Ambulatory Visit: Payer: Self-pay

## 2021-07-10 ENCOUNTER — Ambulatory Visit: Payer: 59

## 2021-07-10 DIAGNOSIS — N644 Mastodynia: Secondary | ICD-10-CM

## 2022-06-09 DIAGNOSIS — L728 Other follicular cysts of the skin and subcutaneous tissue: Secondary | ICD-10-CM | POA: Diagnosis not present

## 2022-06-27 IMAGING — MG MM  DIGITAL DIAGNOSTIC BREAST BILAT IMPLANT W/ TOMO W/ CAD
8 of 17 series · 8 of 40 positions shown · non-contrast
Comparison: Previous exam(s).

CLINICAL DATA: Breast pain.

EXAM:
DIGITAL DIAGNOSTIC BILATERAL MAMMOGRAM WITH IMPLANTS, CAD AND
TOMOSYNTHESIS; ULTRASOUND LEFT BREAST LIMITED
TECHNIQUE: Bilateral digital diagnostic mammography and breast tomosynthesis
was performed. The images were evaluated with computer-aided
detection. Standard and/or implant displaced views were performed.;
Targeted ultrasound examination of the left breast was performed.

[R CC]
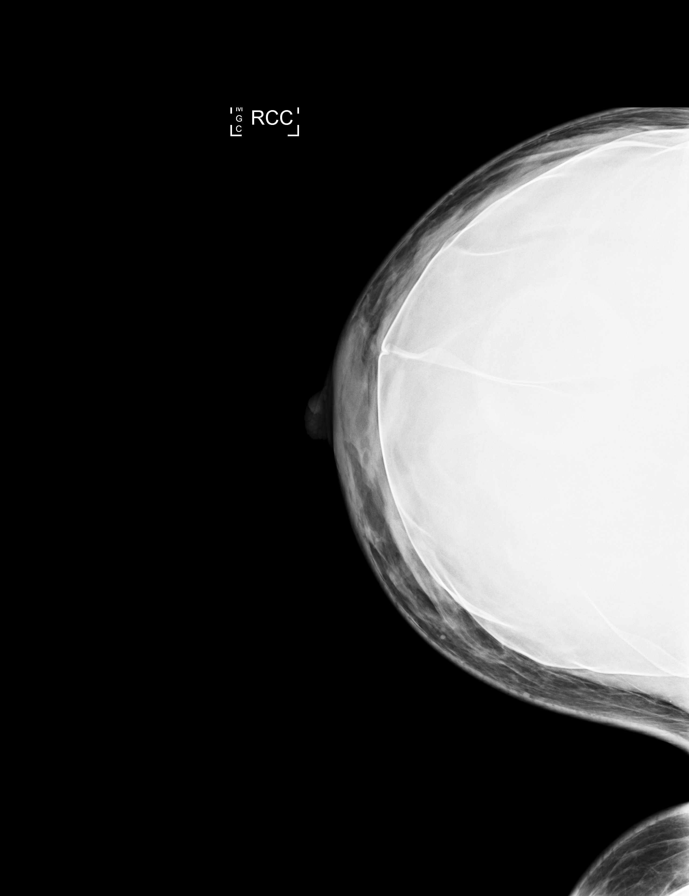

[L CC]
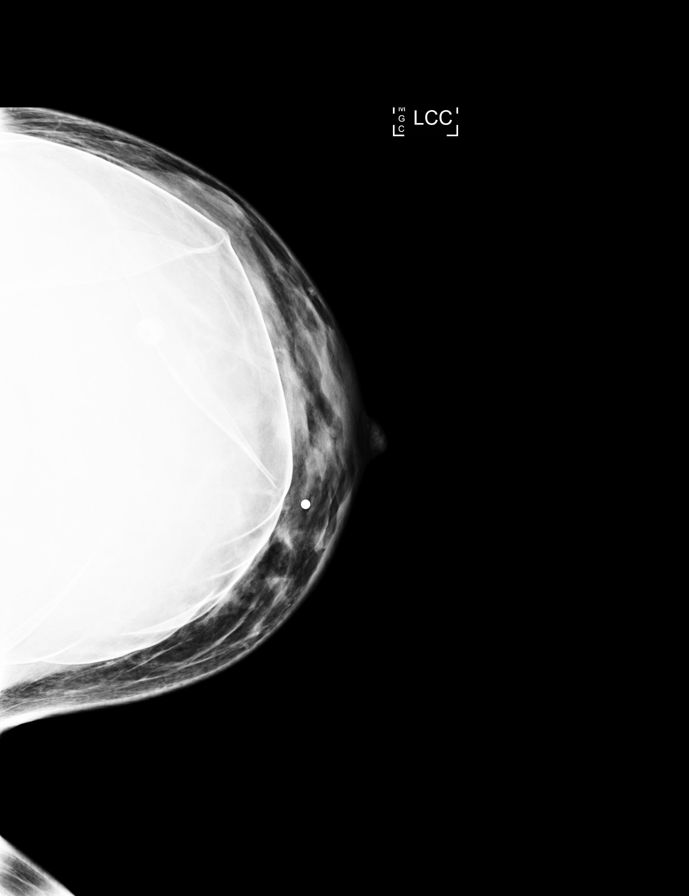

[L MLO]
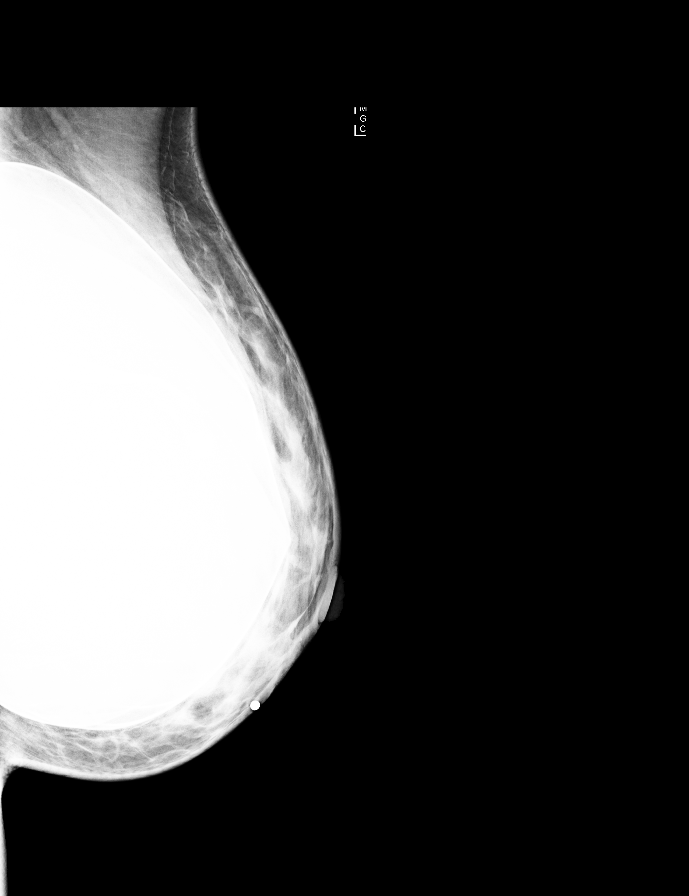

[R MLO]
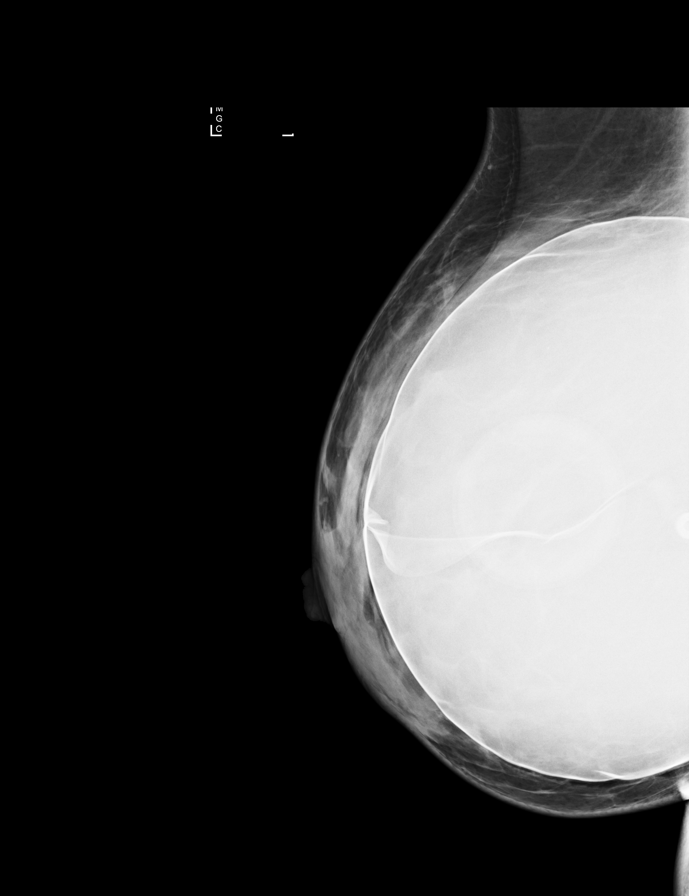

[R MLO synth-2D]
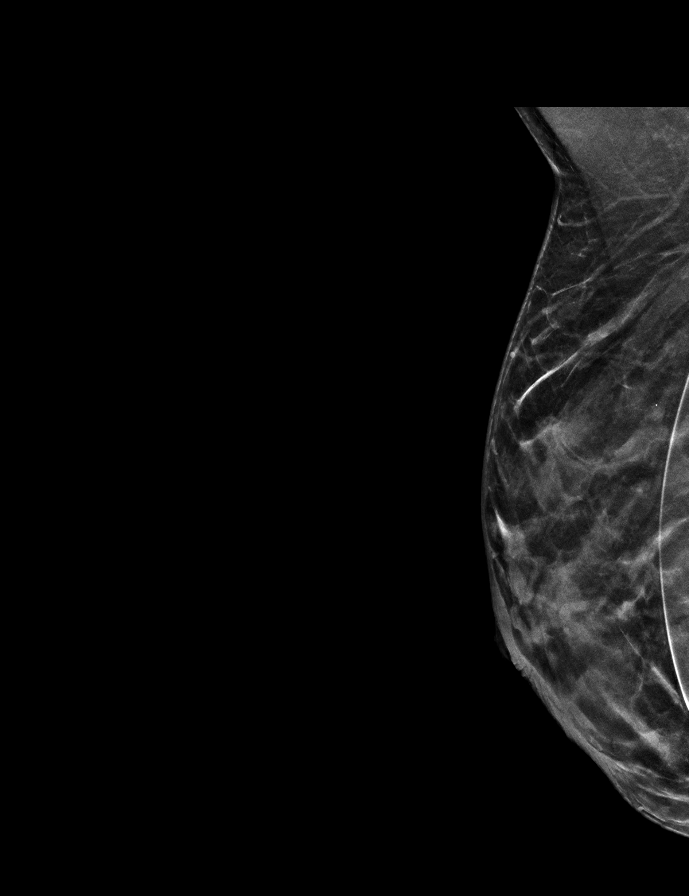

[L MLO synth-2D (1 of 2)]
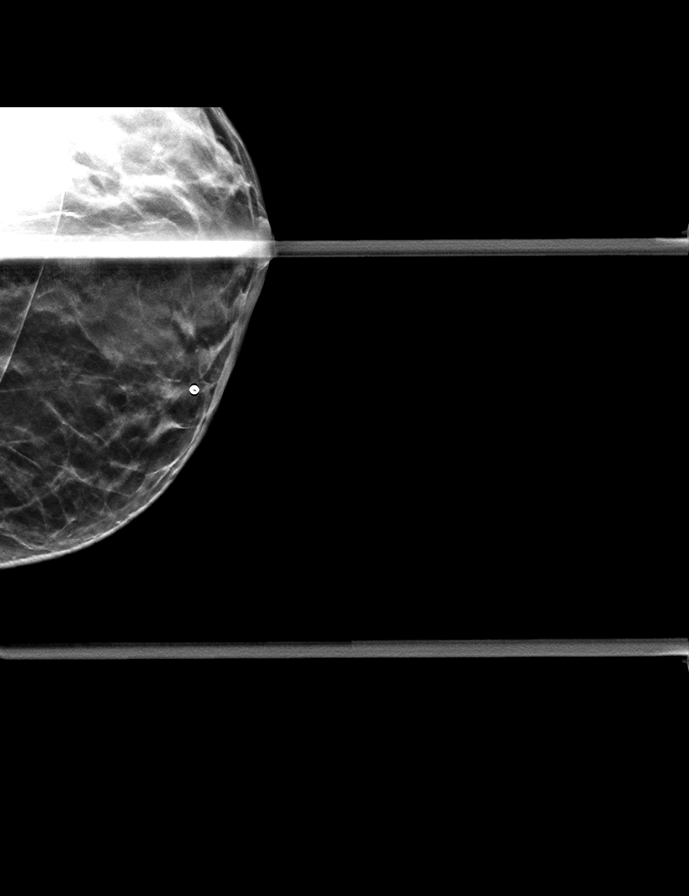

[R CC synth-2D]
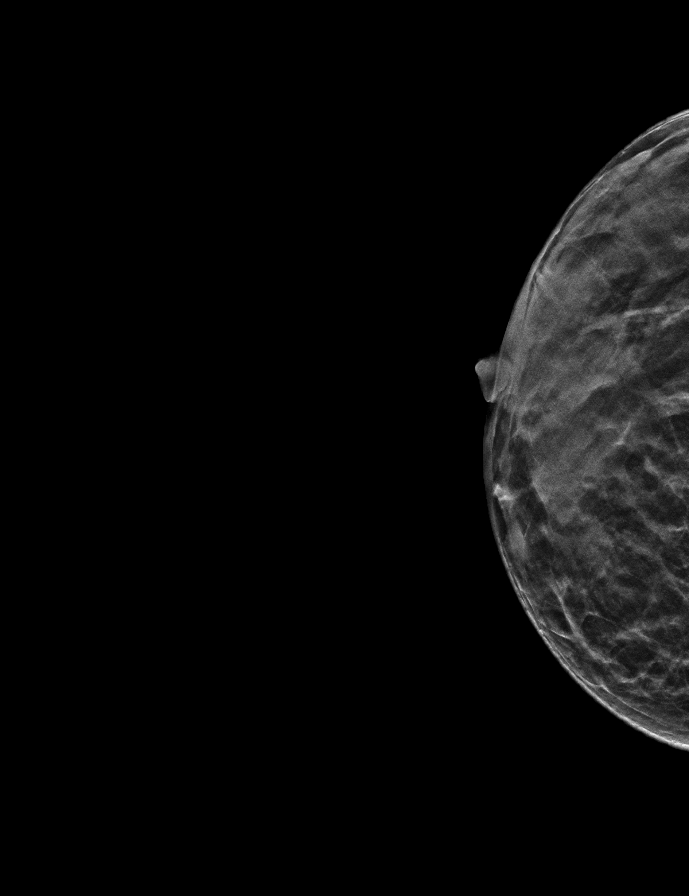

[L MLO synth-2D (2 of 2)]
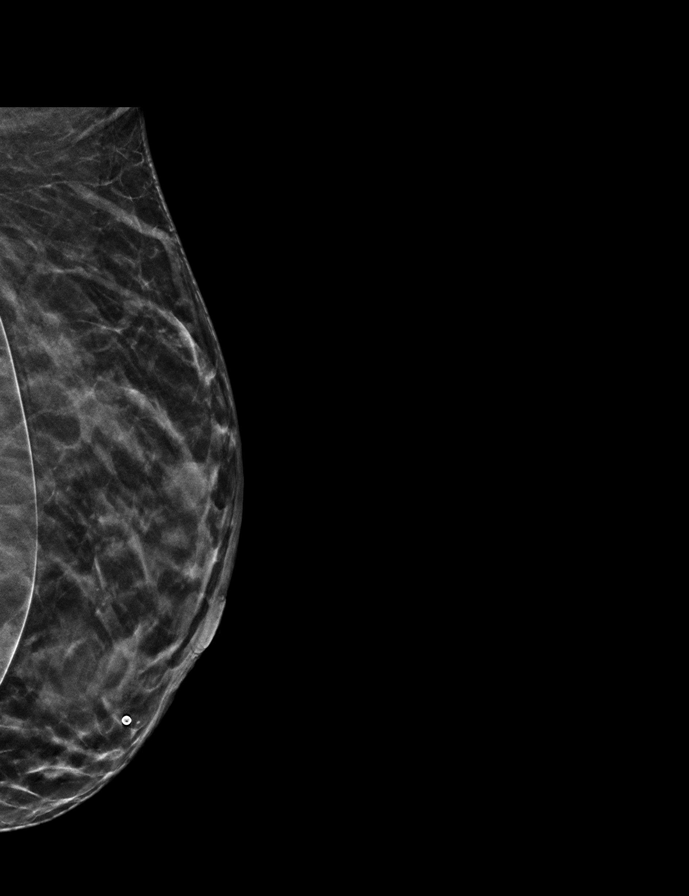

[8 of 40 positions shown; findings below may reference images not displayed]

ACR Breast Density Category c: The breast tissue is heterogeneously
dense, which may obscure small masses.
FINDINGS: An asymmetry in the left breast on the cc view resolves on
additional imaging. No suspicious mammographic findings are
identified bilaterally. The patient has retropectoral implants.

On physical exam, no suspicious lumps are identified.

Targeted ultrasound is performed, showing no sonographic correlate
the patient's pain.
IMPRESSION: No mammographic or sonographic evidence of malignancy.

RECOMMENDATION:
Treatment of the patient's symptoms should be based on clinical and
physical exam given lack findings. Recommend annual screening
mammography.

I have discussed the findings and recommendations with the patient.
If applicable, a reminder letter will be sent to the patient
regarding the next appointment.

BI-RADS CATEGORY  1: Negative.

## 2022-07-17 DIAGNOSIS — Z01419 Encounter for gynecological examination (general) (routine) without abnormal findings: Secondary | ICD-10-CM | POA: Diagnosis not present

## 2022-09-14 ENCOUNTER — Other Ambulatory Visit: Payer: Self-pay | Admitting: Obstetrics and Gynecology

## 2022-09-14 DIAGNOSIS — Z1231 Encounter for screening mammogram for malignant neoplasm of breast: Secondary | ICD-10-CM

## 2022-11-05 ENCOUNTER — Ambulatory Visit (INDEPENDENT_AMBULATORY_CARE_PROVIDER_SITE_OTHER): Payer: BC Managed Care – PPO | Admitting: Family Medicine

## 2022-11-05 ENCOUNTER — Ambulatory Visit (HOSPITAL_COMMUNITY)
Admission: RE | Admit: 2022-11-05 | Discharge: 2022-11-05 | Disposition: A | Discharge status: Home / Self Care | Payer: BC Managed Care – PPO | Source: Ambulatory Visit | Attending: Family Medicine | Admitting: Family Medicine

## 2022-11-05 DIAGNOSIS — R051 Acute cough: Secondary | ICD-10-CM | POA: Insufficient documentation

## 2022-11-05 DIAGNOSIS — R059 Cough, unspecified: Secondary | ICD-10-CM | POA: Diagnosis not present

## 2022-11-05 DIAGNOSIS — R6889 Other general symptoms and signs: Secondary | ICD-10-CM | POA: Diagnosis not present

## 2022-11-05 NOTE — Progress Notes (Signed)
   Subjective:    Patient ID: Brandi Webster, female    DOB: 02/09/1981, 42 y.o.   MRN: [id number]  HPI Patient works as a Merchandiser, retail Had respiratory illness that started last week Then over the past few days with low-grade fever Describes some body aches low energy.  Over the weekend she felt horrible Monday Tuesday Wednesday felt a little bit better but she had to work full days every single day and now she is feeling terrible again coughing up some discolored mucus.  Brownish. Patient presents today with respiratory illness Number of days present-Monday 11/02/22  Symptoms include- flu like symptoms, low grade fever, headache, back/neck pain, cough(brown mucus)  Presence of worrisome signs (severe shortness of breath, lethargy, etc.) - shortness of breath at times  Recent/current visit to urgent care or ER- none  Recent direct exposure to Covid- no She related that she felt a little short of breath Any current Covid testing- no    Review of Systems     Objective:   Physical Exam She does not appear toxic eardrums are good throat is good neck no masses lungs are clear no crackles respiratory rate is normal  Chest x-ray ordered came back showing no pneumonia     Assessment & Plan:  Viral syndrome Flulike illness Triple swab at this point would be not likely to add any useful information to her care Antibiotics not indicated currently Secondary infection signs were discussed in detail Patient to keep [place] updated Patient to rest up over the next few days May need additional testing if worsening illness

## 2022-11-09 ENCOUNTER — Encounter: Payer: Self-pay | Admitting: Family Medicine

## 2022-11-09 ENCOUNTER — Ambulatory Visit
Admission: RE | Admit: 2022-11-09 | Discharge: 2022-11-09 | Disposition: A | Discharge status: Home / Self Care | Payer: BC Managed Care – PPO | Source: Ambulatory Visit | Attending: Obstetrics and Gynecology | Admitting: Obstetrics and Gynecology

## 2022-11-09 DIAGNOSIS — Z1231 Encounter for screening mammogram for malignant neoplasm of breast: Secondary | ICD-10-CM | POA: Diagnosis not present

## 2022-11-09 NOTE — Telephone Encounter (Signed)
Nurses With her having prolonged off-and-on fevers I would recommend starting antibiotics  Current recommendations for secondary infection include Adding 2 medications Amoxicillin 500 mg take 2 tablets 3 times daily for 7 days  Also Zithromax (Z-Pak) 2 pills now then 1 pill daily for 4 straight days.  The combination of these 2 antibiotics to cover for secondary infection It is quite possible that [facility] had flu or RSV causing significant prolonged viral illness with off-and-on fevers now with some secondary infection  If [facility] gets worse over the next 3 to 5 days very important for her to let us know so we can recheck her. Please send in the medications and please notify [facility]

## 2022-11-10 MED ORDER — AZITHROMYCIN 250 MG PO TABS: ORAL_TABLET | ORAL | 0 refills | Status: DC

## 2022-11-10 MED ORDER — AMOXICILLIN 500 MG PO CAPS: ORAL_CAPSULE | ORAL | 0 refills | Status: DC

## 2022-11-11 ENCOUNTER — Other Ambulatory Visit: Payer: Self-pay | Admitting: Obstetrics and Gynecology

## 2022-11-11 DIAGNOSIS — R928 Other abnormal and inconclusive findings on diagnostic imaging of breast: Secondary | ICD-10-CM

## 2022-11-17 ENCOUNTER — Ambulatory Visit
Admission: RE | Admit: 2022-11-17 | Discharge: 2022-11-17 | Disposition: A | Discharge status: Home / Self Care | Payer: BC Managed Care – PPO | Source: Ambulatory Visit | Attending: Obstetrics and Gynecology | Admitting: Obstetrics and Gynecology

## 2022-11-17 ENCOUNTER — Ambulatory Visit
Admission: RE | Admit: 2022-11-17 | Discharge: 2022-11-17 | Disposition: A | Discharge status: Home / Self Care | Payer: 59 | Source: Ambulatory Visit | Attending: Obstetrics and Gynecology | Admitting: Obstetrics and Gynecology

## 2022-11-17 DIAGNOSIS — N6489 Other specified disorders of breast: Secondary | ICD-10-CM | POA: Diagnosis not present

## 2022-11-17 DIAGNOSIS — R928 Other abnormal and inconclusive findings on diagnostic imaging of breast: Secondary | ICD-10-CM

## 2022-11-17 DIAGNOSIS — R922 Inconclusive mammogram: Secondary | ICD-10-CM | POA: Diagnosis not present

## 2023-02-10 DIAGNOSIS — Z713 Dietary counseling and surveillance: Secondary | ICD-10-CM | POA: Diagnosis not present

## 2023-04-19 ENCOUNTER — Encounter: Payer: Self-pay | Admitting: Family Medicine

## 2023-05-10 ENCOUNTER — Encounter: Payer: Self-pay | Admitting: Family Medicine

## 2023-05-10 MED ORDER — CIPROFLOXACIN HCL 250 MG PO TABS: 250.0000 mg | ORAL_TABLET | Freq: Two times a day (BID) | ORAL | 0 refills | Status: AC

## 2023-05-10 NOTE — Telephone Encounter (Signed)
Nurses-please send in medication May do Cipro 250 milligram 1 twice daily for 3 days #6  May forward message to [facility]  Long-term it would be good in the future if UTI symptoms if possible to be seen so we can do a urine culture.  More recently there are various bacteria that can be resistant to antibiotics and by doing the culture we can help pinpoint.  Repetitive use of Cipro is not our favorite because of potential long-term negative side effects.  If worsening symptoms with these issues/UTI will need to be seen thanks

## 2023-11-09 ENCOUNTER — Other Ambulatory Visit: Payer: Self-pay | Admitting: Obstetrics and Gynecology

## 2023-11-09 DIAGNOSIS — Z1231 Encounter for screening mammogram for malignant neoplasm of breast: Secondary | ICD-10-CM

## 2023-11-24 ENCOUNTER — Ambulatory Visit
Admission: RE | Admit: 2023-11-24 | Discharge: 2023-11-24 | Disposition: A | Discharge status: Home / Self Care | Payer: 59 | Source: Ambulatory Visit | Attending: Obstetrics and Gynecology | Admitting: Obstetrics and Gynecology

## 2023-11-24 DIAGNOSIS — Z1231 Encounter for screening mammogram for malignant neoplasm of breast: Secondary | ICD-10-CM

## 2023-11-28 ENCOUNTER — Encounter: Payer: Self-pay | Admitting: Family Medicine

## 2023-11-29 ENCOUNTER — Ambulatory Visit: Payer: 59 | Admitting: Physician Assistant

## 2023-11-29 ENCOUNTER — Other Ambulatory Visit (HOSPITAL_COMMUNITY)
Admission: RE | Admit: 2023-11-29 | Discharge: 2023-11-29 | Disposition: A | Discharge status: Home / Self Care | Payer: 59 | Source: Ambulatory Visit | Attending: Physician Assistant | Admitting: Physician Assistant

## 2023-11-29 ENCOUNTER — Ambulatory Visit (HOSPITAL_COMMUNITY)
Admission: RE | Admit: 2023-11-29 | Discharge: 2023-11-29 | Disposition: A | Discharge status: Home / Self Care | Payer: 59 | Source: Ambulatory Visit | Attending: Physician Assistant | Admitting: Physician Assistant

## 2023-11-29 VITALS — BP 145/88 | HR 111 | Temp 98.2°F | Ht 62.0 in | Wt 127.0 lb

## 2023-11-29 DIAGNOSIS — R053 Chronic cough: Secondary | ICD-10-CM

## 2023-11-29 LAB — CBC WITH DIFFERENTIAL/PLATELET
Abs Immature Granulocytes: 0.01 10*3/uL (ref 0.00–0.07)
Basophils Absolute: 0 10*3/uL (ref 0.0–0.1)
Basophils Relative: 1 %
Eosinophils Absolute: 0 10*3/uL (ref 0.0–0.5)
Eosinophils Relative: 1 %
HCT: 39.8 % (ref 36.0–46.0)
Hemoglobin: 13.1 g/dL (ref 12.0–15.0)
Immature Granulocytes: 0 %
Lymphocytes Relative: 19 %
Lymphs Abs: 0.8 10*3/uL (ref 0.7–4.0)
MCH: 30.6 pg (ref 26.0–34.0)
MCHC: 32.9 g/dL (ref 30.0–36.0)
MCV: 93 fL (ref 80.0–100.0)
Monocytes Absolute: 0.6 10*3/uL (ref 0.1–1.0)
Monocytes Relative: 14 %
Neutro Abs: 2.9 10*3/uL (ref 1.7–7.7)
Neutrophils Relative %: 65 %
Platelets: 277 10*3/uL (ref 150–400)
RBC: 4.28 MIL/uL (ref 3.87–5.11)
RDW: 12.6 % (ref 11.5–15.5)
WBC: 4.4 10*3/uL (ref 4.0–10.5)
nRBC: 0 % (ref 0.0–0.2)

## 2023-11-29 LAB — COMPREHENSIVE METABOLIC PANEL
ALT: 15 U/L (ref 0–44)
AST: 18 U/L (ref 15–41)
Albumin: 4.1 g/dL (ref 3.5–5.0)
Alkaline Phosphatase: 61 U/L (ref 38–126)
Anion gap: 9 (ref 5–15)
BUN: 11 mg/dL (ref 6–20)
CO2: 24 mmol/L (ref 22–32)
Calcium: 9.4 mg/dL (ref 8.9–10.3)
Chloride: 103 mmol/L (ref 98–111)
Creatinine, Ser: 0.72 mg/dL (ref 0.44–1.00)
GFR, Estimated: >60 mL/min (ref >60)
Glucose, Bld: 95 mg/dL (ref 70–99)
Potassium: 4 mmol/L (ref 3.5–5.1)
Sodium: 136 mmol/L (ref 135–145)
Total Bilirubin: 0.6 mg/dL (ref 0.0–1.2)
Total Protein: 7.3 g/dL (ref 6.5–8.1)

## 2023-11-29 MED ORDER — PREDNISONE 20 MG PO TABS: 40.0000 mg | ORAL_TABLET | Freq: Every day | ORAL | 0 refills | Status: AC

## 2023-11-29 NOTE — Progress Notes (Addendum)
 Acute Office Visit  Subjective:     Patient ID: Brandi Webster, female    DOB: May 05, 1981, 43 y.o.   MRN: [id number]   Patient presents today for concerns of persistent cough. She states symptoms began in November with cough and mild URI symptoms that resolved in approx 1 week with OTC management. She states she then again had cough starting in mid Dec into January. She reports a telehealth visit and treatment with Z-pack at that time with some improvement.  Today she complains cough, malaise, headache, and body ahces began Friday evening with some improvement over the weekend, however symptoms are more bothersome today. She denies fevers, congestion, or shortness or breath. She endorses chest and back pain from coughing. She denies sick contacts.     Review of Systems  Constitutional:  Positive for malaise/fatigue. Negative for fever.  HENT:  Negative for congestion and sore throat.   Respiratory:  Positive for cough. Negative for shortness of breath and wheezing.   Cardiovascular:  Positive for chest pain (with coughing). Negative for palpitations.  Musculoskeletal:  Positive for back pain (with coughing) and myalgias. Negative for joint pain.  Neurological:  Positive for headaches.        Objective:     BP (!) 145/88   Pulse (!) 111   Temp 98.2 F (36.8 C)   Ht 5\' 2"  (1.575 m)   Wt 127 lb (57.6 kg)   LMP 11/03/2023   SpO2 98%   BMI 23.23 kg/m   Physical Exam Vitals reviewed.  Constitutional:      General: She is not in acute distress.    Appearance: Normal appearance.  HENT:     Nose: Nose normal.     Mouth/Throat:     Mouth: Mucous membranes are moist.     Pharynx: Oropharynx is clear.  Eyes:     Extraocular Movements: Extraocular movements intact.     Conjunctiva/sclera: Conjunctivae normal.  Cardiovascular:     Rate and Rhythm: Normal rate and regular rhythm.     Heart sounds: No murmur heard.    No friction rub. No gallop.  Pulmonary:     Effort:  Pulmonary effort is normal.     Breath sounds: Normal breath sounds. No stridor. No wheezing, rhonchi or rales.  Musculoskeletal:        General: Normal range of motion.  Skin:    General: Skin is warm and dry.     Capillary Refill: Capillary refill takes less than 2 seconds.  Neurological:     General: No focal deficit present.     Mental Status: She is alert and oriented to person, place, and time.  Psychiatric:        Mood and Affect: Mood normal.        Behavior: Behavior normal.     Results for orders placed or performed during the hospital encounter of 11/29/23  CBC with Differential/Platelet  Result Value Ref Range   WBC 4.4 4.0 - 10.5 K/uL   RBC 4.28 3.87 - 5.11 MIL/uL   Hemoglobin 13.1 12.0 - 15.0 g/dL   HCT 40.9 81.1 - 91.4 %   MCV 93.0 80.0 - 100.0 fL   MCH 30.6 26.0 - 34.0 pg   MCHC 32.9 30.0 - 36.0 g/dL   RDW 78.2 95.6 - 21.3 %   Platelets 277 150 - 400 K/uL   nRBC 0.0 0.0 - 0.2 %   Neutrophils Relative % 65 %   Neutro Abs 2.9 1.7 -  7.7 K/uL   Lymphocytes Relative 19 %   Lymphs Abs 0.8 0.7 - 4.0 K/uL   Monocytes Relative 14 %   Monocytes Absolute 0.6 0.1 - 1.0 K/uL   Eosinophils Relative 1 %   Eosinophils Absolute 0.0 0.0 - 0.5 K/uL   Basophils Relative 1 %   Basophils Absolute 0.0 0.0 - 0.1 K/uL   Immature Granulocytes 0 %   Abs Immature Granulocytes 0.01 0.00 - 0.07 K/uL        Assessment & Plan:  Persistent cough for 3 weeks or longer -     DG Chest 2 View -     CBC with Differential/Platelet -     CMP14+EGFR -     predniSONE ; Take 2 tablets (40 mg total) by mouth daily for 5 days.  Dispense: 10 tablet; Refill: 0   Patient appears stable today. Benign exam, lungs clear to auscultation bilaterally, will go ahead with chest XR today due to persistent nature of cough for more than 1 month. CBC and CMP per patient request for lab work and concern for ongoing nature of symptoms. Supportive care reviewed with patient. Discussed with patient that there  are no indications for antibiotics at this time. Prednisone  x 5 days for cough and inflammation. Tylenol  or ibuprofen  for pain as needed. May continue with OTC cold medications. Patient instructed to return to clinic if worsening shortness of breath, chest pain, hypoxia, or other concerns. Patient agreeable to plan.   CBC stable at this time.   Return if symptoms worsen or fail to improve.  Jearlean Mince Alvis Pulcini, PA-C

## 2023-11-30 ENCOUNTER — Encounter: Payer: Self-pay | Admitting: Physician Assistant

## 2023-12-01 ENCOUNTER — Encounter: Payer: Self-pay | Admitting: Family Medicine

## 2023-12-01 ENCOUNTER — Other Ambulatory Visit: Payer: Self-pay | Admitting: Family Medicine

## 2023-12-01 MED ORDER — AMOXICILLIN-POT CLAVULANATE 875-125 MG PO TABS: 1.0000 | ORAL_TABLET | Freq: Two times a day (BID) | ORAL | 0 refills | Status: AC

## 2023-12-01 NOTE — Telephone Encounter (Signed)
Nurses  I had a good conversation with Brandi Webster-over the past several days she has had respiratory symptoms and more recently some fever a prescription for Augmentin was sent in to cover for the possibility of secondary bacterial infection.  She was instructed that if she gets worse to reconnect with [place] to be rechecked  Also she has had to some degree of persistent cough over the past several months with multiple viral illnesses.  It is quite possible she is developing some reactive airway due to the frequent respiratory illnesses.  It would be wise to go ahead with a consultation with Dr. Dellis Anes or one of his partners with asthma immunology [place] office. Brandi Webster is aware that we are setting this up.  Please go ahead with the referral.  You may send her notification that this has been initiated-typically allergist office will reach out to her within the next 2 weeks to help set up the appointment  Thanks-Dr. Lorin Picket

## 2023-12-02 ENCOUNTER — Other Ambulatory Visit: Payer: Self-pay

## 2023-12-02 DIAGNOSIS — Z8709 Personal history of other diseases of the respiratory system: Secondary | ICD-10-CM

## 2023-12-06 ENCOUNTER — Encounter: Payer: Self-pay | Admitting: Dermatology

## 2024-01-17 ENCOUNTER — Other Ambulatory Visit: Payer: Self-pay

## 2024-01-17 ENCOUNTER — Ambulatory Visit: Payer: 59 | Admitting: Internal Medicine

## 2024-01-17 ENCOUNTER — Encounter: Payer: Self-pay | Admitting: Internal Medicine

## 2024-01-17 VITALS — BP 126/90 | HR 81 | Temp 98.7°F | Resp 12 | Ht 62.21 in | Wt 124.2 lb

## 2024-01-17 DIAGNOSIS — B999 Unspecified infectious disease: Secondary | ICD-10-CM

## 2024-01-17 DIAGNOSIS — J3089 Other allergic rhinitis: Secondary | ICD-10-CM

## 2024-01-17 DIAGNOSIS — R053 Chronic cough: Secondary | ICD-10-CM

## 2024-01-17 MED ORDER — ALBUTEROL SULFATE HFA 108 (90 BASE) MCG/ACT IN AERS: 2.0000 | INHALATION_SPRAY | Freq: Four times a day (QID) | RESPIRATORY_TRACT | 1 refills | Status: AC | PRN

## 2024-01-17 MED ORDER — ASMANEX HFA 100 MCG/ACT IN AERO: 2.0000 | INHALATION_SPRAY | Freq: Two times a day (BID) | RESPIRATORY_TRACT | 5 refills | Status: AC

## 2024-01-17 NOTE — Patient Instructions (Addendum)
 Chronic Cough - Maintenance inhaler: start Asmanex 2 puffs twice daily.  - Rescue inhaler: Albuterol 2 puffs or 1 vial via nebulizer every 4-6 hours as needed for respiratory symptoms of cough, shortness of breath, or wheezing   Other Allergic Rhinitis: - Hold all anti-histamines (Xyzal, Allegra, Zyrtec, Claritin, Benadryl, Pepcid) 3 days prior to next visit.    Recurrent Infections - Keep track of infections and antibiotic use.    Follow up: 830 AM on 4/7 for skin testing 1-55

## 2024-01-17 NOTE — Progress Notes (Signed)
 NEW PATIENT  Date of Service/Encounter:  01/17/24  Consult requested by: Babs Sciara, MD   Subjective:   Brandi Webster (DOB: [birth year]) is a 43 y.o. female who presents to the clinic on 01/17/2024 with a chief complaint of Breathing Problem and Cough .    History obtained from: chart review and patient.   Cough/SOB: No hx of asthma  Ongoing since November 2025.  Feels a tickle in her lungs and just randomly happens throughout the day even when just sitting there.   She has been coughing so bad that sometimes has urinary incontinence.  Also notes some shortness of breath with the cough and decreased exercise tolerance.  Has had second hand smoke exposure when younger. Currently a Designer, television/film set, mostly office work.  Some on and off trouble with reflux but has improved over the last few years with improving her diet. Does have trouble with post nasal drip and chronic allergies, uses honey and it does help.   Has not tried any inhalers.   Recurrent Infections: Notes have walking pneumonia and multiple respiratory infections this Fall.   No GI/skin infections/deep organ/abscess. No hospitalization for infections or IV abx.    Rhinitis:  Started since childhood.  Symptoms include: nasal congestion, rhinorrhea, post nasal drainage, watery eyes, and itchy eyes  Occurs seasonally-Spring/Fall  Potential triggers: not sure  Treatments tried:  Zyrtec makes her sleepy Honey   Previous allergy testing: no History of sinus surgery: no Nonallergic triggers: none   Reviewed:  11/2023: normal WBC, with diff, Hgb and Plt.   11/29/2023: seen by Grooms PA for persistent cough after a mild URI.  Cough persistent since Nov/Dec. Previously given azithromycin with some improvement.  Also started on prednisone this visit.   11/2023: CXR IMPRESSION: No active cardiopulmonary disease.  11/05/2022: seen by Dr Gerda Diss for fatigue, fevers, mucoid cough, headaches, SOB.  Works as Sales promotion account executive. Discussed likely viral illness, symptomatic care at home.   Past Medical History: Past Medical History:  Diagnosis Date   Headache(784.0)    otc med prn   Heart murmur    Heartburn in pregnancy    PONV (postoperative nausea and vomiting)    Pregnancy induced hypertension    first preg   Preterm labor    2nd preg   Recurrent upper respiratory infection (URI)    Vaginal Pap smear, abnormal    colpo, ok since    Past Surgical History: Past Surgical History:  Procedure Laterality Date   AUGMENTATION MAMMAPLASTY Bilateral    2017   BREAST SURGERY     augmentation   CESAREAN SECTION  10/20/2007   TONSILLECTOMY     WISDOM TOOTH EXTRACTION      Family History: Family History  Problem Relation Age of Onset   Ovarian cancer Maternal Aunt 67 - 69   Diabetes Maternal Grandmother    Heart disease Maternal Grandmother    Hearing loss Neg Hx    BRCA 1/2 Neg Hx    Breast cancer Neg Hx     Social History:  Flooring in bedroom: laminate Pets: dog Tobacco use/exposure: second hand when younger  Job: Designer, television/film set   Medication List:  Allergies as of 01/17/2024       Reactions   Codeine Nausea And Vomiting        Medication List        Accurate as of January 17, 2024 12:12 PM. If you have any questions, ask your nurse or doctor.  amoxicillin-clavulanate 875-125 MG tablet Commonly known as: AUGMENTIN Take 1 tablet by mouth 2 (two) times daily.   PRESERVISION AREDS PO   TRIPLEX AD PO Take by mouth.         REVIEW OF SYSTEMS: Pertinent positives and negatives discussed in HPI.   Objective:   Physical Exam: BP (!) 126/90   Pulse 81   Temp 98.7 F (37.1 C)   Resp 12   Ht 5' 2.21" (1.58 m)   Wt 124 lb 3.2 oz (56.3 kg)   SpO2 96%   BMI 22.57 kg/m  Body mass index is 22.57 kg/m. GEN: alert, well developed HEENT: clear conjunctiva, nose with + mild inferior turbinate hypertrophy, pink nasal mucosa, slight clear rhinorrhea, +  cobblestoning HEART: regular rate and rhythm, no murmur LUNGS: clear to auscultation bilaterally, no coughing, unlabored respiration ABDOMEN: soft, non distended  SKIN: no rashes or lesions  Spirometry:  Tracings reviewed. Her effort: Good reproducible efforts. FVC: 3.08L, 91% predicted FEV1: 2.44L, 88% predicted FEV1/FVC ratio: 79% Interpretation: Spirometry consistent with normal pattern.  Please see scanned spirometry results for details.  Assessment:   1. Other allergic rhinitis   2. Recurrent infections   3. Chronic cough     Plan/Recommendations:  Chronic Cough - MDI technique discussed.  Spirometry today was normal.  Will try starting on ICS for possibly cough variant asthma. Will keep GERD and post nasal drip in ddx.   - Maintenance inhaler: start Asmanex 2 puffs twice daily.  - Rescue inhaler: Albuterol 2 puffs or 1 vial via nebulizer every 4-6 hours as needed for respiratory symptoms of cough, shortness of breath, or wheezing   Other Allergic Rhinitis: - Due to turbinate hypertrophy, seasonal symptoms, chronic cough and unresponsive to over the counter meds, will perform skin testing to identify aeroallergen triggers.   - Hold all anti-histamines (Xyzal, Allegra, Zyrtec, Claritin, Benadryl, Pepcid) 3 days prior to next visit.  - Will discuss Allegra due to no side effects of sedation.    Recurrent Infections - Keep track of infections and antibiotic use. Mostly with respiratory infections this Fall.     Follow up: 830 AM 4/7 for skin testing 1-55, IDs okay    Alesia Morin, MD [facility]

## 2024-01-24 ENCOUNTER — Ambulatory Visit: Admitting: Internal Medicine
